# Patient Record
Sex: Female | Born: 1960 | Race: White | Hispanic: No | Marital: Married | Smoking: Never smoker
Health system: Southern US, Community
[De-identification: ages and names within clinical notes are randomized; demographics above are authoritative.]

## PROBLEM LIST (undated history)

## (undated) DIAGNOSIS — L509 Urticaria, unspecified: Secondary | ICD-10-CM

## (undated) DIAGNOSIS — F32A Depression, unspecified: Secondary | ICD-10-CM

## (undated) DIAGNOSIS — F329 Major depressive disorder, single episode, unspecified: Secondary | ICD-10-CM

## (undated) DIAGNOSIS — E7800 Pure hypercholesterolemia, unspecified: Secondary | ICD-10-CM

## (undated) HISTORY — DX: Depression, unspecified: F32.A

## (undated) HISTORY — DX: Major depressive disorder, single episode, unspecified: F32.9

## (undated) HISTORY — DX: Urticaria, unspecified: L50.9

---

## 1988-07-06 HISTORY — PX: TUBAL LIGATION: SHX77

## 1992-07-06 HISTORY — PX: CERVICAL CONE BIOPSY: SUR198

## 1998-11-18 ENCOUNTER — Other Ambulatory Visit: Admission: RE | Admit: 1998-11-18 | Discharge: 1998-11-18 | Payer: Self-pay | Admitting: Family Medicine

## 2002-11-30 ENCOUNTER — Other Ambulatory Visit: Admission: RE | Admit: 2002-11-30 | Discharge: 2002-11-30 | Payer: Self-pay | Admitting: Obstetrics and Gynecology

## 2004-01-29 ENCOUNTER — Other Ambulatory Visit: Admission: RE | Admit: 2004-01-29 | Discharge: 2004-01-29 | Payer: Self-pay | Admitting: Obstetrics and Gynecology

## 2005-03-10 ENCOUNTER — Other Ambulatory Visit: Admission: RE | Admit: 2005-03-10 | Discharge: 2005-03-10 | Payer: Self-pay | Admitting: Obstetrics and Gynecology

## 2013-11-21 ENCOUNTER — Ambulatory Visit: Payer: Self-pay | Admitting: Nurse Practitioner

## 2014-01-16 ENCOUNTER — Ambulatory Visit (INDEPENDENT_AMBULATORY_CARE_PROVIDER_SITE_OTHER): Payer: PRIVATE HEALTH INSURANCE | Admitting: Nurse Practitioner

## 2014-01-16 ENCOUNTER — Ambulatory Visit (INDEPENDENT_AMBULATORY_CARE_PROVIDER_SITE_OTHER): Payer: PRIVATE HEALTH INSURANCE

## 2014-01-16 ENCOUNTER — Encounter: Payer: Self-pay | Admitting: Nurse Practitioner

## 2014-01-16 ENCOUNTER — Encounter (INDEPENDENT_AMBULATORY_CARE_PROVIDER_SITE_OTHER): Payer: Self-pay

## 2014-01-16 VITALS — BP 110/64 | HR 68 | Temp 98.8°F | Ht 68.0 in | Wt 167.8 lb

## 2014-01-16 DIAGNOSIS — F411 Generalized anxiety disorder: Secondary | ICD-10-CM | POA: Insufficient documentation

## 2014-01-16 DIAGNOSIS — J301 Allergic rhinitis due to pollen: Secondary | ICD-10-CM

## 2014-01-16 DIAGNOSIS — Z1211 Encounter for screening for malignant neoplasm of colon: Secondary | ICD-10-CM

## 2014-01-16 DIAGNOSIS — J309 Allergic rhinitis, unspecified: Secondary | ICD-10-CM | POA: Insufficient documentation

## 2014-01-16 DIAGNOSIS — Z0000 Encounter for general adult medical examination without abnormal findings: Secondary | ICD-10-CM

## 2014-01-16 LAB — POCT CBC
GRANULOCYTE PERCENT: 48.4 % (ref 37–80)
HCT, POC: 40.6 % (ref 37.7–47.9)
HEMOGLOBIN: 13.1 g/dL (ref 12.2–16.2)
LYMPH, POC: 2.2 (ref 0.6–3.4)
MCH: 31.2 pg (ref 27–31.2)
MCHC: 32.4 g/dL (ref 31.8–35.4)
MCV: 96.2 fL (ref 80–97)
MPV: 9.2 fL (ref 0–99.8)
PLATELET COUNT, POC: 194 10*3/uL (ref 142–424)
POC GRANULOCYTE: 2.3 (ref 2–6.9)
POC LYMPH %: 46.9 % (ref 10–50)
RBC: 4.2 M/uL (ref 4.04–5.48)
RDW, POC: 12.9 %
WBC: 4.7 10*3/uL (ref 4.6–10.2)

## 2014-01-16 NOTE — Patient Instructions (Signed)

## 2014-01-16 NOTE — Progress Notes (Signed)
Subjective:    Patient ID: Alyssa Hoffman, female    DOB: 08-11-1960, 53 y.o.   MRN: [id number]  HPI Patinet in today for annual physical exam- she has not been seen in awhile- she is doing well today without coplaints. Patient Active Problem List   Diagnosis Date Noted  . GAD (generalized anxiety disorder) 01/16/2014  . Allergic rhinitis 01/16/2014   Outpatient Encounter Prescriptions as of 01/16/2014  Medication Sig  . citalopram (CELEXA) 20 MG tablet Take 20 mg by mouth daily.  Marland Kitchen loratadine (CLARITIN) 10 MG tablet Take 10 mg by mouth daily.       Review of Systems  Constitutional: Negative.   HENT: Negative.   Eyes: Negative.   Respiratory: Negative.   Cardiovascular: Negative.   Gastrointestinal: Negative.   Genitourinary: Negative.   Neurological: Negative.   Psychiatric/Behavioral: Negative.   All other systems reviewed and are negative.      Objective:   Physical Exam  Constitutional: She is oriented to person, place, and time. She appears well-developed and well-nourished.  HENT:  Head: Normocephalic.  Right Ear: Hearing, tympanic membrane, external ear and ear canal normal.  Left Ear: Hearing, tympanic membrane, external ear and ear canal normal.  Nose: Nose normal.  Mouth/Throat: Uvula is midline, oropharynx is clear and moist and mucous membranes are normal.  Eyes: Conjunctivae and EOM are normal. Pupils are equal, round, and reactive to light.  Neck: Normal range of motion. Neck supple. No JVD present. No thyromegaly present.  Cardiovascular: Normal rate, regular rhythm, normal heart sounds and intact distal pulses.   No murmur heard. Pulmonary/Chest: Effort normal and breath sounds normal. She has no wheezes. She has no rales.  Abdominal: Soft. Bowel sounds are normal. She exhibits no distension and no mass. There is no tenderness.  Musculoskeletal: Normal range of motion.  Neurological: She is alert and oriented to person, place, and time. She has  normal reflexes.  Skin: Skin is warm and dry.  Psychiatric: She has a normal mood and affect. Her behavior is normal. Judgment and thought content normal.   BP 110/64  Pulse 68  Temp(Src) 98.8 F (37.1 C) (Oral)  Ht 5' 8"  (1.727 m)  Wt 167 lb 12.8 oz (76.114 kg)  BMI 25.52 kg/m2  Adella Nissen, [name] Chest x ray- normal-Preliminary reading by Ronnald Collum, [name]  [facility]        Assessment & Plan:   1. GAD (generalized anxiety disorder)   2. Allergic rhinitis due to pollen   3. Encounter for screening colonoscopy   4. Annual physical exam    Orders Placed This Encounter  Procedures  . DG Chest 2 View    Standing Status: Future     Number of Occurrences: 1     Standing Expiration Date: 03/18/2015    Order Specific Question:  Reason for Exam (SYMPTOM  OR DIAGNOSIS REQUIRED)    Answer:  screening    Order Specific Question:  Is the patient pregnant?    Answer:  No    Order Specific Question:  Preferred imaging location?    Answer:  Internal  . CMP14+EGFR  . NMR, lipoprofile  . Thyroid Panel With TSH  . Vit D  25 hydroxy (rtn osteoporosis monitoring)  . Ambulatory referral to Gastroenterology    Referral Priority:  Routine    Referral Type:  Consultation    Referral Reason:  Specialty Services Required    Requested Specialty:  Gastroenterology    Number of Visits Requested:  1  . POCT CBC  . EKG 12-Lead   Meds ordered this encounter  Medications  . citalopram (CELEXA) 20 MG tablet    Sig: Take 20 mg by mouth daily.  Marland Kitchen loratadine (CLARITIN) 10 MG tablet    Sig: Take 10 mg by mouth daily.    Labs pending Health maintenance reviewed Diet and exercise encouraged Continue all meds Follow up  In 6 months   [place], [name]

## 2014-01-17 LAB — NMR, LIPOPROFILE
CHOLESTEROL: 263 mg/dL — AB (ref 100–199)
HDL CHOLESTEROL BY NMR: 69 mg/dL (ref >39)
HDL Particle Number: 43.2 umol/L (ref >=30.5)
LDL PARTICLE NUMBER: 1666 nmol/L — AB (ref <1000)
LDL SIZE: 21.7 nm (ref >20.5)
LDLC SERPL CALC-MCNC: 166 mg/dL — AB (ref 0–99)
LP-IR Score: 31 (ref <=45)
Small LDL Particle Number: 469 nmol/L (ref <=527)
TRIGLYCERIDES BY NMR: 141 mg/dL (ref 0–149)

## 2014-01-17 LAB — CMP14+EGFR
ALBUMIN: 4.5 g/dL (ref 3.5–5.5)
ALT: 19 IU/L (ref 0–32)
AST: 19 IU/L (ref 0–40)
Albumin/Globulin Ratio: 2.3 (ref 1.1–2.5)
Alkaline Phosphatase: 67 IU/L (ref 39–117)
BILIRUBIN TOTAL: 0.5 mg/dL (ref 0.0–1.2)
BUN/Creatinine Ratio: 15 (ref 9–23)
BUN: 11 mg/dL (ref 6–24)
CALCIUM: 9.8 mg/dL (ref 8.7–10.2)
CHLORIDE: 100 mmol/L (ref 97–108)
CO2: 26 mmol/L (ref 18–29)
Creatinine, Ser: 0.75 mg/dL (ref 0.57–1.00)
GFR calc non Af Amer: 90 mL/min/{1.73_m2} (ref >59)
GFR, EST AFRICAN AMERICAN: 104 mL/min/{1.73_m2} (ref >59)
GLUCOSE: 80 mg/dL (ref 65–99)
Globulin, Total: 2 g/dL (ref 1.5–4.5)
POTASSIUM: 4.9 mmol/L (ref 3.5–5.2)
Sodium: 144 mmol/L (ref 134–144)
TOTAL PROTEIN: 6.5 g/dL (ref 6.0–8.5)

## 2014-01-17 LAB — VITAMIN D 25 HYDROXY (VIT D DEFICIENCY, FRACTURES): VIT D 25 HYDROXY: 56.5 ng/mL (ref 30.0–100.0)

## 2014-01-29 ENCOUNTER — Encounter: Payer: Self-pay | Admitting: Nurse Practitioner

## 2014-02-16 ENCOUNTER — Encounter: Payer: Self-pay | Admitting: Internal Medicine

## 2014-04-25 ENCOUNTER — Encounter: Payer: Self-pay | Admitting: Internal Medicine

## 2014-11-07 ENCOUNTER — Encounter: Payer: Self-pay | Admitting: Internal Medicine

## 2015-01-28 ENCOUNTER — Encounter: Payer: Self-pay | Admitting: Internal Medicine

## 2015-02-18 ENCOUNTER — Ambulatory Visit (AMBULATORY_SURGERY_CENTER): Payer: PRIVATE HEALTH INSURANCE | Admitting: *Deleted

## 2015-02-18 VITALS — Ht 67.5 in | Wt 168.0 lb

## 2015-02-18 DIAGNOSIS — Z1211 Encounter for screening for malignant neoplasm of colon: Secondary | ICD-10-CM

## 2015-02-18 MED ORDER — NA SULFATE-K SULFATE-MG SULF 17.5-3.13-1.6 GM/177ML PO SOLN: ORAL | Status: DC

## 2015-02-18 NOTE — Progress Notes (Signed)
Patient denies any allergies to eggs or soy. Patient denies any problems with sedation. Patient denies any oxygen use at home and does not take any diet/weight loss medications. EMMI education assisgned to patient on colonoscopy, this was explained and instructions given to patient. 

## 2015-02-19 ENCOUNTER — Encounter: Payer: Self-pay | Admitting: Internal Medicine

## 2015-03-04 ENCOUNTER — Ambulatory Visit (AMBULATORY_SURGERY_CENTER): Payer: PRIVATE HEALTH INSURANCE | Admitting: Internal Medicine

## 2015-03-04 ENCOUNTER — Encounter: Payer: Self-pay | Admitting: Internal Medicine

## 2015-03-04 VITALS — BP 135/78 | HR 58 | Temp 96.3°F | Resp 16 | Ht 67.5 in | Wt 168.0 lb

## 2015-03-04 DIAGNOSIS — Z1211 Encounter for screening for malignant neoplasm of colon: Secondary | ICD-10-CM

## 2015-03-04 MED ORDER — SODIUM CHLORIDE 0.9 % IV SOLN: 500.0000 mL | INTRAVENOUS | Status: DC

## 2015-03-04 NOTE — Op Note (Signed)
[facility] [address] [place], [address]   COLONOSCOPY PROCEDURE REPORT  PATIENT: Alyssa Hoffman, Alyssa Hoffman  MR#: [id number] BIRTHDATE: 1961/06/30 , 54  yrs. old GENDER: female ENDOSCOPIST: Beverley Fiedler, MD REFERRED XB:JYNW Sheron Nightingale, N.P. PROCEDURE DATE:  03/04/2015 PROCEDURE:   Colonoscopy, screening First Screening Colonoscopy - Avg.  risk and is 50 yrs.  old or older Yes.  Prior Negative Screening - Now for repeat screening. N/A  History of Adenoma - Now for follow-up colonoscopy & has been > or = to 3 yrs.  N/A  Polyps removed today? No Polyps removed today? No Recommend repeat exam, <10 yrs? No ASA CLASS:   Class II INDICATIONS:Screening for colonic neoplasia and Colorectal Neoplasm Risk Assessment for this procedure is average risk., 1st colonoscopy MEDICATIONS: Monitored anesthesia care and Propofol 200 mg IV  DESCRIPTION OF PROCEDURE:   After the risks benefits and alternatives of the procedure were thoroughly explained, informed consent was obtained.  The digital rectal exam revealed no rectal mass.   The LB PCF Q180 [id number]  endoscope was introduced through the anus and advanced to the cecum, which was identified by both the appendix and ileocecal valve. No adverse events experienced. The quality of the prep was good.  (Suprep was used)  The instrument was then slowly withdrawn as the colon was fully examined. Estimated blood loss is zero unless otherwise noted in this procedure report.      COLON FINDINGS: A normal appearing cecum, ileocecal valve, and appendiceal orifice were identified.  The ascending, transverse, descending, sigmoid colon, and rectum appeared unremarkable. Retroflexed views revealed internal hemorrhoids. The time to cecum = 5.3 Withdrawal time = 9.9   The scope was withdrawn and the procedure completed.  COMPLICATIONS: There were no immediate complications.  ENDOSCOPIC IMPRESSION: Normal colonoscopy  RECOMMENDATIONS: You  should continue to follow colorectal cancer screening guidelines for "routine risk" patients with a repeat colonoscopy in 10 years. There is no need for FOBT (stool) testing for at least 5 years.  eSigned:  Beverley Fiedler, MD 03/04/2015 9:42 AM   cc: Bennie Pierini, NP and The Patient

## 2015-03-04 NOTE — Progress Notes (Signed)
Report to PACU, RN, vss, BBS= Clear.  

## 2015-03-04 NOTE — Patient Instructions (Signed)
Discharge instructions given. Normal exam. Resume previous medications. YOU HAD AN ENDOSCOPIC PROCEDURE TODAY AT THE [facility]:   Refer to the procedure report that was given to you for any specific questions about what was found during the examination.  If the procedure report does not answer your questions, please call your gastroenterologist to clarify.  If you requested that your care partner not be given the details of your procedure findings, then the procedure report has been included in a sealed envelope for you to review at your convenience later.  YOU SHOULD EXPECT: Some feelings of bloating in the abdomen. Passage of more gas than usual.  Walking can help get rid of the air that was put into your GI tract during the procedure and reduce the bloating. If you had a lower endoscopy (such as a colonoscopy or flexible sigmoidoscopy) you may notice spotting of blood in your stool or on the toilet paper. If you underwent a bowel prep for your procedure, you may not have a normal bowel movement for a few days.  Please Note:  You might notice some irritation and congestion in your nose or some drainage.  This is from the oxygen used during your procedure.  There is no need for concern and it should clear up in a day or so.  SYMPTOMS TO REPORT IMMEDIATELY:   Following lower endoscopy (colonoscopy or flexible sigmoidoscopy):  Excessive amounts of blood in the stool  Significant tenderness or worsening of abdominal pains  Swelling of the abdomen that is new, acute  Fever of 100F or higher   For urgent or emergent issues, a gastroenterologist can be reached at any hour by calling [phone].   DIET: Your first meal following the procedure should be a small meal and then it is ok to progress to your normal diet. Heavy or fried foods are harder to digest and may make you feel nauseous or bloated.  Likewise, meals heavy in dairy and vegetables can increase bloating.  Drink plenty  of fluids but you should avoid alcoholic beverages for 24 hours.  ACTIVITY:  You should plan to take it easy for the rest of today and you should NOT DRIVE or use heavy machinery until tomorrow (because of the sedation medicines used during the test).    FOLLOW UP: Our staff will call the number listed on your records the next business day following your procedure to check on you and address any questions or concerns that you may have regarding the information given to you following your procedure. If we do not reach you, we will leave a message.  However, if you are feeling well and you are not experiencing any problems, there is no need to return our call.  We will assume that you have returned to your regular daily activities without incident.  If any biopsies were taken you will be contacted by phone or by letter within the next 1-3 weeks.  Please call us at [phone] if you have not heard about the biopsies in 3 weeks.    SIGNATURES/CONFIDENTIALITY: You and/or your care partner have signed paperwork which will be entered into your electronic medical record.  These signatures attest to the fact that that the information above on your After Visit Summary has been reviewed and is understood.  Full responsibility of the confidentiality of this discharge information lies with you and/or your care-partner. 

## 2015-03-05 ENCOUNTER — Telehealth: Payer: Self-pay

## 2015-03-05 NOTE — Telephone Encounter (Signed)
  Follow up Call-  Call back number 03/04/2015  Post procedure Call Back phone  # [phone]  Permission to leave phone message Yes     Patient questions:  Do you have a fever, pain , or abdominal swelling? No. Pain Score  0 *  Have you tolerated food without any problems? Yes.    Have you been able to return to your normal activities? Yes.    Do you have any questions about your discharge instructions: Diet   No. Medications  No. Follow up visit  No.  Do you have questions or concerns about your Care? No.  Actions: * If pain score is 4 or above: No action needed, pain <4.

## 2016-01-01 ENCOUNTER — Other Ambulatory Visit: Payer: Self-pay | Admitting: Obstetrics and Gynecology

## 2016-01-01 DIAGNOSIS — R928 Other abnormal and inconclusive findings on diagnostic imaging of breast: Secondary | ICD-10-CM

## 2016-01-02 ENCOUNTER — Emergency Department (HOSPITAL_COMMUNITY)
Admission: EM | Admit: 2016-01-02 | Discharge: 2016-01-02 | Disposition: A | Discharge status: Home / Self Care | Payer: PRIVATE HEALTH INSURANCE | Attending: Emergency Medicine | Admitting: Emergency Medicine

## 2016-01-02 ENCOUNTER — Encounter (HOSPITAL_COMMUNITY): Payer: Self-pay | Admitting: Emergency Medicine

## 2016-01-02 ENCOUNTER — Emergency Department (HOSPITAL_COMMUNITY): Payer: PRIVATE HEALTH INSURANCE

## 2016-01-02 DIAGNOSIS — R079 Chest pain, unspecified: Secondary | ICD-10-CM | POA: Insufficient documentation

## 2016-01-02 DIAGNOSIS — Z79899 Other long term (current) drug therapy: Secondary | ICD-10-CM | POA: Insufficient documentation

## 2016-01-02 DIAGNOSIS — F329 Major depressive disorder, single episode, unspecified: Secondary | ICD-10-CM | POA: Insufficient documentation

## 2016-01-02 HISTORY — DX: Pure hypercholesterolemia, unspecified: E78.00

## 2016-01-02 LAB — CBC
HCT: 41.9 % (ref 36.0–46.0)
HEMOGLOBIN: 14.3 g/dL (ref 12.0–15.0)
MCH: 32.6 pg (ref 26.0–34.0)
MCHC: 34.1 g/dL (ref 30.0–36.0)
MCV: 95.4 fL (ref 78.0–100.0)
PLATELETS: 227 10*3/uL (ref 150–400)
RBC: 4.39 MIL/uL (ref 3.87–5.11)
RDW: 12.2 % (ref 11.5–15.5)
WBC: 4.9 10*3/uL (ref 4.0–10.5)

## 2016-01-02 LAB — BASIC METABOLIC PANEL
ANION GAP: 9 (ref 5–15)
BUN: 11 mg/dL (ref 6–20)
CHLORIDE: 102 mmol/L (ref 101–111)
CO2: 27 mmol/L (ref 22–32)
Calcium: 9.5 mg/dL (ref 8.9–10.3)
Creatinine, Ser: 0.9 mg/dL (ref 0.44–1.00)
GFR calc Af Amer: >60 mL/min (ref >60)
GLUCOSE: 95 mg/dL (ref 65–99)
POTASSIUM: 4.1 mmol/L (ref 3.5–5.1)
SODIUM: 138 mmol/L (ref 135–145)

## 2016-01-02 LAB — TROPONIN I: Troponin I: <0.03 ng/mL (ref <0.03)

## 2016-01-02 MED ORDER — NITROGLYCERIN 0.4 MG SL SUBL: 0.4000 mg | SUBLINGUAL_TABLET | SUBLINGUAL | Status: DC | PRN

## 2016-01-02 MED ORDER — GI COCKTAIL ~~LOC~~
30.0000 mL | Freq: Once | ORAL | Status: AC
  Administered 2016-01-02: 30 mL via ORAL
  Filled 2016-01-02: qty 30

## 2016-01-02 MED ORDER — ASPIRIN 81 MG PO CHEW
324.0000 mg | CHEWABLE_TABLET | Freq: Once | ORAL | Status: AC
  Administered 2016-01-02: 324 mg via ORAL
  Filled 2016-01-02: qty 4

## 2016-01-02 NOTE — ED Notes (Signed)
MD at bedside. 

## 2016-01-02 NOTE — ED Notes (Signed)
PT refused further evaluation or admission for full cardiac workup.

## 2016-01-02 NOTE — Discharge Instructions (Signed)
Take your usual prescriptions as previously directed.  Call your regular medical doctor tomorrow to schedule a follow up appointment within the next day. Call the Cardiologist tomorrow to schedule a follow up appointment within the next 3 days.  Return to the Emergency Department immediately sooner if worsening.

## 2016-01-02 NOTE — ED Provider Notes (Signed)
CSN: [id number]     Arrival date & time 01/02/16  1309 History   First MD Initiated Contact with Patient 01/02/16 1456     Chief Complaint  Patient presents with  . Chest Pain     HPI Pt was seen at 1505. Per pt, c/o gradual onset and resolution of one episode of CP that occurred today approximately 11am. Pt was sitting in a chair typing when her symptoms began. Pt describes the CP as "squeezing," lasting approximately 15-1min before spontaneously resolving. States now the area is "sore feeling." Has been associated with "pressure" in her jaw and back, as well as diaphoresis. Pt states this is the 3rd episode in 3 months. States her brother died of a MI at 36yo. Denies palpitations, no SOB/cough, no abd pain, no N/V/D, no rash, no fevers.    Past Medical History  Diagnosis Date  . Depression   . Hives of unknown origin   . High cholesterol    Past Surgical History  Procedure Laterality Date  . Tubal ligation  1990  . Cervical cone biopsy  1994   Family History  Problem Relation Age of Onset  . Colon cancer Neg Hx   . Heart attack Brother 56    deceased   Social History  Substance Use Topics  . Smoking status: Never Smoker   . Smokeless tobacco: Never Used  . Alcohol Use: 1.2 oz/week    2 Cans of beer per week     Comment: social    Review of Systems ROS: Statement: All systems negative except as marked or noted in the HPI; Constitutional: Negative for fever and chills. ; ; Eyes: Negative for eye pain, redness and discharge. ; ; ENMT: Negative for ear pain, hoarseness, nasal congestion, sinus pressure and sore throat. ; ; Cardiovascular: +CP, diaphoresis. Negative for palpitations, dyspnea and peripheral edema. ; ; Respiratory: Negative for cough, wheezing and stridor. ; ; Gastrointestinal: Negative for nausea, vomiting, diarrhea, abdominal pain, blood in stool, hematemesis, jaundice and rectal bleeding. . ; ; Genitourinary: Negative for dysuria, flank pain and hematuria. ; ;  Musculoskeletal: Negative for back pain and neck pain. Negative for swelling and trauma.; ; Skin: Negative for pruritus, rash, abrasions, blisters, bruising and skin lesion.; ; Neuro: Negative for headache, lightheadedness and neck stiffness. Negative for weakness, altered level of consciousness, altered mental status, extremity weakness, paresthesias, involuntary movement, seizure and syncope.      Allergies  Codeine and Shrimp  Home Medications   Prior to Admission medications   Medication Sig Start Date End Date Taking? Authorizing Provider  Ascorbic Acid (VITAMIN C PO) Take 1 tablet by mouth daily.   Yes Historical Provider, MD  B Complex Vitamins (VITAMIN B COMPLEX PO) Take 1 tablet by mouth daily.   Yes Historical Provider, MD  Cholecalciferol (VITAMIN D PO) Take 1 tablet by mouth daily.   Yes Historical Provider, MD  citalopram (CELEXA) 20 MG tablet Take 20 mg by mouth daily.   Yes Historical Provider, MD  loratadine (CLARITIN) 10 MG tablet Take 10 mg by mouth daily.   Yes Historical Provider, MD   BP 133/79 mmHg  Pulse 75  Temp(Src) 98.2 F (36.8 C) (Temporal)  Resp 14  Ht  (1.727 m)  Wt 187 lb (84.823 kg)  BMI 28.44 kg/m2  SpO2 95% Physical Exam  1510: Physical examination:  Nursing notes reviewed; Vital signs and O2 SAT reviewed;  Constitutional: Well developed, Well nourished, Well hydrated, In no acute distress; Head:  Normocephalic, atraumatic; Eyes: EOMI, PERRL, No scleral icterus; ENMT: Mouth and pharynx normal, Mucous membranes moist; Neck: Supple, Full range of motion, No lymphadenopathy; Cardiovascular: Regular rate and rhythm, No gallop; Respiratory: Breath sounds clear & equal bilaterally, No wheezes.  Speaking full sentences with ease, Normal respiratory effort/excursion; Chest: Nontender, Movement normal; Abdomen: Soft, Nontender, Nondistended, Normal bowel sounds; Genitourinary: No CVA tenderness; Extremities: Pulses normal, No tenderness, No edema, No calf  edema or asymmetry.; Neuro: AA&Ox3, Major CN grossly intact.  Speech clear. No gross focal motor or sensory deficits in extremities.; Skin: Color normal, Warm, Dry.   ED Course  Procedures (including critical care time) Labs Review  Imaging Review  I have personally reviewed and evaluated these images and lab results as part of my medical decision-making.   EKG Interpretation   Date/Time:  Thursday January 02 2016 13:13:03 EDT Ventricular Rate:  82 PR Interval:  188 QRS Duration: 84 QT Interval:  364 QTC Calculation: 425 R Axis:   6 Text Interpretation:  Normal sinus rhythm Possible Left atrial enlargement  No old tracing to compare Confirmed by [facility] Hospital[facility]US  MD, Nicholos JohnsKATHLEEN [phone]([id number]) on  01/02/2016 4:06:39 PM      MDM  MDM Reviewed: previous chart, nursing note and vitals Reviewed previous: labs and ECG Interpretation: labs, ECG and x-ray   Results for orders placed or performed during the hospital encounter of 01/02/16  CBC  Result Value Ref Range   WBC 4.9 4.0 - 10.5 K/uL   RBC 4.39 3.87 - 5.11 MIL/uL   Hemoglobin 14.3 12.0 - 15.0 g/dL   HCT 60.441.9 54.036.0 - 98.146.0 %   MCV 95.4 78.0 - 100.0 fL   MCH 32.6 26.0 - 34.0 pg   MCHC 34.1 30.0 - 36.0 g/dL   RDW 19.112.2 47.811.5 - 29.515.5 %   Platelets 227 150 - 400 K/uL  Basic metabolic panel  Result Value Ref Range   Sodium 138 135 - 145 mmol/L   Potassium 4.1 3.5 - 5.1 mmol/L   Chloride 102 101 - 111 mmol/L   CO2 27 22 - 32 mmol/L   Glucose, Bld 95 65 - 99 mg/dL   BUN 11 6 - 20 mg/dL   Creatinine, Ser 6.210.90 0.44 - 1.00 mg/dL   Calcium 9.5 8.9 - 30.810.3 mg/dL   GFR calc non Af Amer >60 >60 mL/min   GFR calc Af Amer >60 >60 mL/min   Anion gap 9 5 - 15  Troponin I  (0, 3, 6)  Result Value Ref Range   Troponin I <0.03 <0.03 ng/mL  Troponin I  (0, 3, 6)  Result Value Ref Range   Troponin I <0.03 <0.03 ng/mL   Dg Chest 2 View 01/02/2016  CLINICAL DATA:  Mid chest, BILATERAL shoulder, and BILATERAL jaw pain this morning, third episode in 3  months EXAM: CHEST  2 VIEW COMPARISON:  01/16/2014 FINDINGS: Upper normal heart size. Mediastinal contours and pulmonary vascularity normal. Lungs clear. No pleural effusion or pneumothorax. Pectus excavatum. No acute osseous lesions. IMPRESSION: Pectus excavatum. No acute abnormalities. Electronically Signed   By: Ulyses SouthwardMark  Boles M.D.   On: 01/02/2016 13:32      1755:  Pt informed re: dx testing results, my concern for cardiac source for her symptoms, and that I recommend observation admission for further evaluation.  Pt refuses admission.  I encouraged pt to stay to observation admission, but did agree to stay for 2nd troponin. 2nd troponin reassuring, again refuses observation admission.  Pt makes her own medical decisions.  Risks of AMA explained to pt, including, but not limited to:  stroke, heart attack, cardiac arrythmia ("irregular heart rate/beat"), "passing out," temporary and/or permanent disability, death.  Pt verb understanding and continues to refuse admission, understanding the consequences of her decision.  I encouraged pt to follow up with her PMD and the Cardiologist tomorrow and return to the ED immediately if symptoms return, or for any other concerns.  Pt verb understanding, agreeable.     Samuel JesterKathleen Anisha Starliper, DO 01/05/16 1443

## 2016-01-02 NOTE — ED Notes (Addendum)
Patient complaining of severe "squeezing" to mid sternal area at 1100 today. States she went to Urgent Care and was sent here. States pain has resolved but does still have some soreness to same area. States this is 3rd time this has happened in 3 months. States she also had "pressure to both my jaws and in my back."

## 2016-01-14 ENCOUNTER — Ambulatory Visit
Admission: RE | Admit: 2016-01-14 | Discharge: 2016-01-14 | Disposition: A | Discharge status: Home / Self Care | Payer: BC Managed Care – PPO | Source: Ambulatory Visit | Attending: Obstetrics and Gynecology | Admitting: Obstetrics and Gynecology

## 2016-01-14 DIAGNOSIS — R928 Other abnormal and inconclusive findings on diagnostic imaging of breast: Secondary | ICD-10-CM

## 2016-03-16 ENCOUNTER — Ambulatory Visit: Payer: PRIVATE HEALTH INSURANCE | Admitting: Physician Assistant

## 2016-03-16 ENCOUNTER — Ambulatory Visit (INDEPENDENT_AMBULATORY_CARE_PROVIDER_SITE_OTHER): Payer: Worker's Compensation | Admitting: Physician Assistant

## 2016-03-16 ENCOUNTER — Encounter: Payer: Self-pay | Admitting: Physician Assistant

## 2016-03-16 VITALS — BP 142/85 | HR 83 | Temp 98.0°F | Ht 68.0 in | Wt 190.2 lb

## 2016-03-16 DIAGNOSIS — S39012A Strain of muscle, fascia and tendon of lower back, initial encounter: Secondary | ICD-10-CM

## 2016-03-16 MED ORDER — DICLOFENAC SODIUM 75 MG PO TBEC: 75.0000 mg | DELAYED_RELEASE_TABLET | Freq: Two times a day (BID) | ORAL | 0 refills | Status: DC

## 2016-03-16 MED ORDER — CYCLOBENZAPRINE HCL 10 MG PO TABS: 10.0000 mg | ORAL_TABLET | Freq: Three times a day (TID) | ORAL | 0 refills | Status: DC | PRN

## 2016-03-16 NOTE — Progress Notes (Signed)
BP (!) 142/85 (BP Location: Left Arm, Patient Position: Sitting, Cuff Size: Large)   Pulse 83   Temp 98 F (36.7 C) (Oral)   Ht 5\' 8"  (1.727 m)   Wt 190 lb 3.2 oz (86.3 kg)   BMI 28.92 kg/m    Subjective:    Patient ID: Alyssa Hoffman, female    DOB: 10/02/1960, 55 y.o.   MRN: [id number]  HPI: Alyssa Hoffman is a 55 y.o. female presenting on 03/16/2016 for worker comp (Patient fell going up stairs on 03/10/16. 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 On 03/10/2016 she tripped going up some steps and stubbed her left toe, it is feeling better. She went on into the office and sat down to work. In about 45 minutes when she attempted to stand back up she felt the pain in her left lumbar area. This is where the pain has continued over the past few days. She is used ice and stretching and taken over-the-counter anti-inflammatories with a significant amount of improvement. For the first 24 hours she could hardly bend at the waist. By 9/917 she was able to sit in the chair again. The patient states that she needs to return to work this week and there are accommodations they can make for her not to the lifting anything. The patient does have vacation next week. I feel that with limited lifting and rest that she should be able to return to full duty on 03/30/2016.  Relevant past medical, surgical, family and social history reviewed and updated as indicated. Interim medical history since our last visit reviewed. Allergies and medications reviewed and updated. DATA REVIEWED: CHART IN EPIC  Review of Systems  Constitutional: Negative.  Negative for activity change, fatigue and fever.  HENT: Negative.   Eyes: Negative.   Respiratory: Negative.  Negative for cough.   Cardiovascular: Negative.  Negative for chest pain.  Gastrointestinal: Negative.  Negative for abdominal pain.  Endocrine: Negative.   Genitourinary: Negative.  Negative for dysuria.  Musculoskeletal: Positive for  back pain and myalgias.  Skin: Negative.   Neurological: Negative.     Per HPI unless specifically indicated above     Medication List       Accurate as of 03/16/16  9:59 AM. Always use your most recent med list.          citalopram 20 MG tablet Commonly known as:  CELEXA Take 20 mg by mouth daily.   CONTRAVE 8-90 MG Tb12 Generic drug:  Naltrexone-Bupropion HCl ER   cyclobenzaprine 10 MG tablet Commonly known as:  FLEXERIL Take 1 tablet (10 mg total) by mouth 3 (three) times daily as needed for muscle spasms.   diclofenac 75 MG EC tablet Commonly known as:  VOLTAREN Take 1 tablet (75 mg total) by mouth 2 (two) times daily.   loratadine 10 MG tablet Commonly known as:  CLARITIN Take 10 mg by mouth daily.   VITAMIN B COMPLEX PO Take 1 tablet by mouth daily.   VITAMIN C PO Take 1 tablet by mouth daily.   VITAMIN D PO Take 1 tablet by mouth daily.          Objective:    BP (!) 142/85 (BP Location: Left Arm, Patient Position: Sitting, Cuff Size: Large)   Pulse 83   Temp 98 F (36.7 C) (Oral)   Ht 5\' 8"  (1.727 m)   Wt 190 lb 3.2 oz (86.3 kg)   BMI 28.92  kg/m   Allergies  Allergen Reactions  . Codeine Nausea And Vomiting    Heart palpitations   . Shrimp [Shellfish Allergy]     Wt Readings from Last 3 Encounters:  03/16/16 190 lb 3.2 oz (86.3 kg)  01/02/16 187 lb (84.8 kg)  03/04/15 168 lb (76.2 kg)    Physical Exam  Constitutional: She is oriented to person, place, and time. She appears well-developed and well-nourished.  HENT:  Head: Normocephalic and atraumatic.  Eyes: Conjunctivae and EOM are normal. Pupils are equal, round, and reactive to light.  Cardiovascular: Normal rate, regular rhythm, normal heart sounds and intact distal pulses.   Pulmonary/Chest: Effort normal and breath sounds normal.  Abdominal: Soft. Bowel sounds are normal.  Musculoskeletal:       Lumbar back: She exhibits decreased range of motion, tenderness, swelling, pain  and spasm.  Neurological: She is alert and oriented to person, place, and time. She has normal strength and normal reflexes.  Neg straight leg raise bilaterally  Skin: Skin is warm and dry. No rash noted.  Psychiatric: She has a normal mood and affect. Her behavior is normal. Judgment and thought content normal.        Assessment & Plan:   1. Lumbar strain, initial encounter Continue rest, heat and stretching - diclofenac (VOLTAREN) 75 MG EC tablet; Take 1 tablet (75 mg total) by mouth 2 (two) times daily.  Dispense: 60 tablet; Refill: 0 - cyclobenzaprine (FLEXERIL) 10 MG tablet; Take 1 tablet (10 mg total) by mouth 3 (three) times daily as needed for muscle spasms.  Dispense: 40 tablet; Refill: 0 Return if not improved.  Should return to full duty 03/30/16.  Continue all other maintenance medications as listed above.  Follow up plan: No Follow-up on file.  Counseling provided for all of the age appropriate vaccines. No orders of the defined types were placed in this encounter.   Remus Loffler PA-C [facility] [address]  [place], [place] [address] [phone]   03/16/2016, 9:59 AM

## 2016-03-16 NOTE — Patient Instructions (Signed)

## 2016-04-12 ENCOUNTER — Other Ambulatory Visit: Payer: Self-pay | Admitting: Physician Assistant

## 2016-04-12 DIAGNOSIS — S39012A Strain of muscle, fascia and tendon of lower back, initial encounter: Secondary | ICD-10-CM

## 2016-05-11 ENCOUNTER — Other Ambulatory Visit: Payer: Self-pay | Admitting: *Deleted

## 2016-05-11 DIAGNOSIS — S39012A Strain of muscle, fascia and tendon of lower back, initial encounter: Secondary | ICD-10-CM

## 2016-05-11 MED ORDER — DICLOFENAC SODIUM 75 MG PO TBEC: 75.0000 mg | DELAYED_RELEASE_TABLET | Freq: Two times a day (BID) | ORAL | 0 refills | Status: DC

## 2017-08-09 ENCOUNTER — Ambulatory Visit: Payer: Self-pay | Admitting: Physician Assistant

## 2017-10-01 IMAGING — DX DG CHEST 2V
2 series · 2 of 2 positions shown · non-contrast
Comparison: 01/16/2014

CLINICAL DATA: Mid chest, BILATERAL shoulder, and BILATERAL jaw
pain this morning, third episode in 3 months

EXAM:
CHEST  2 VIEW

[chest pa]
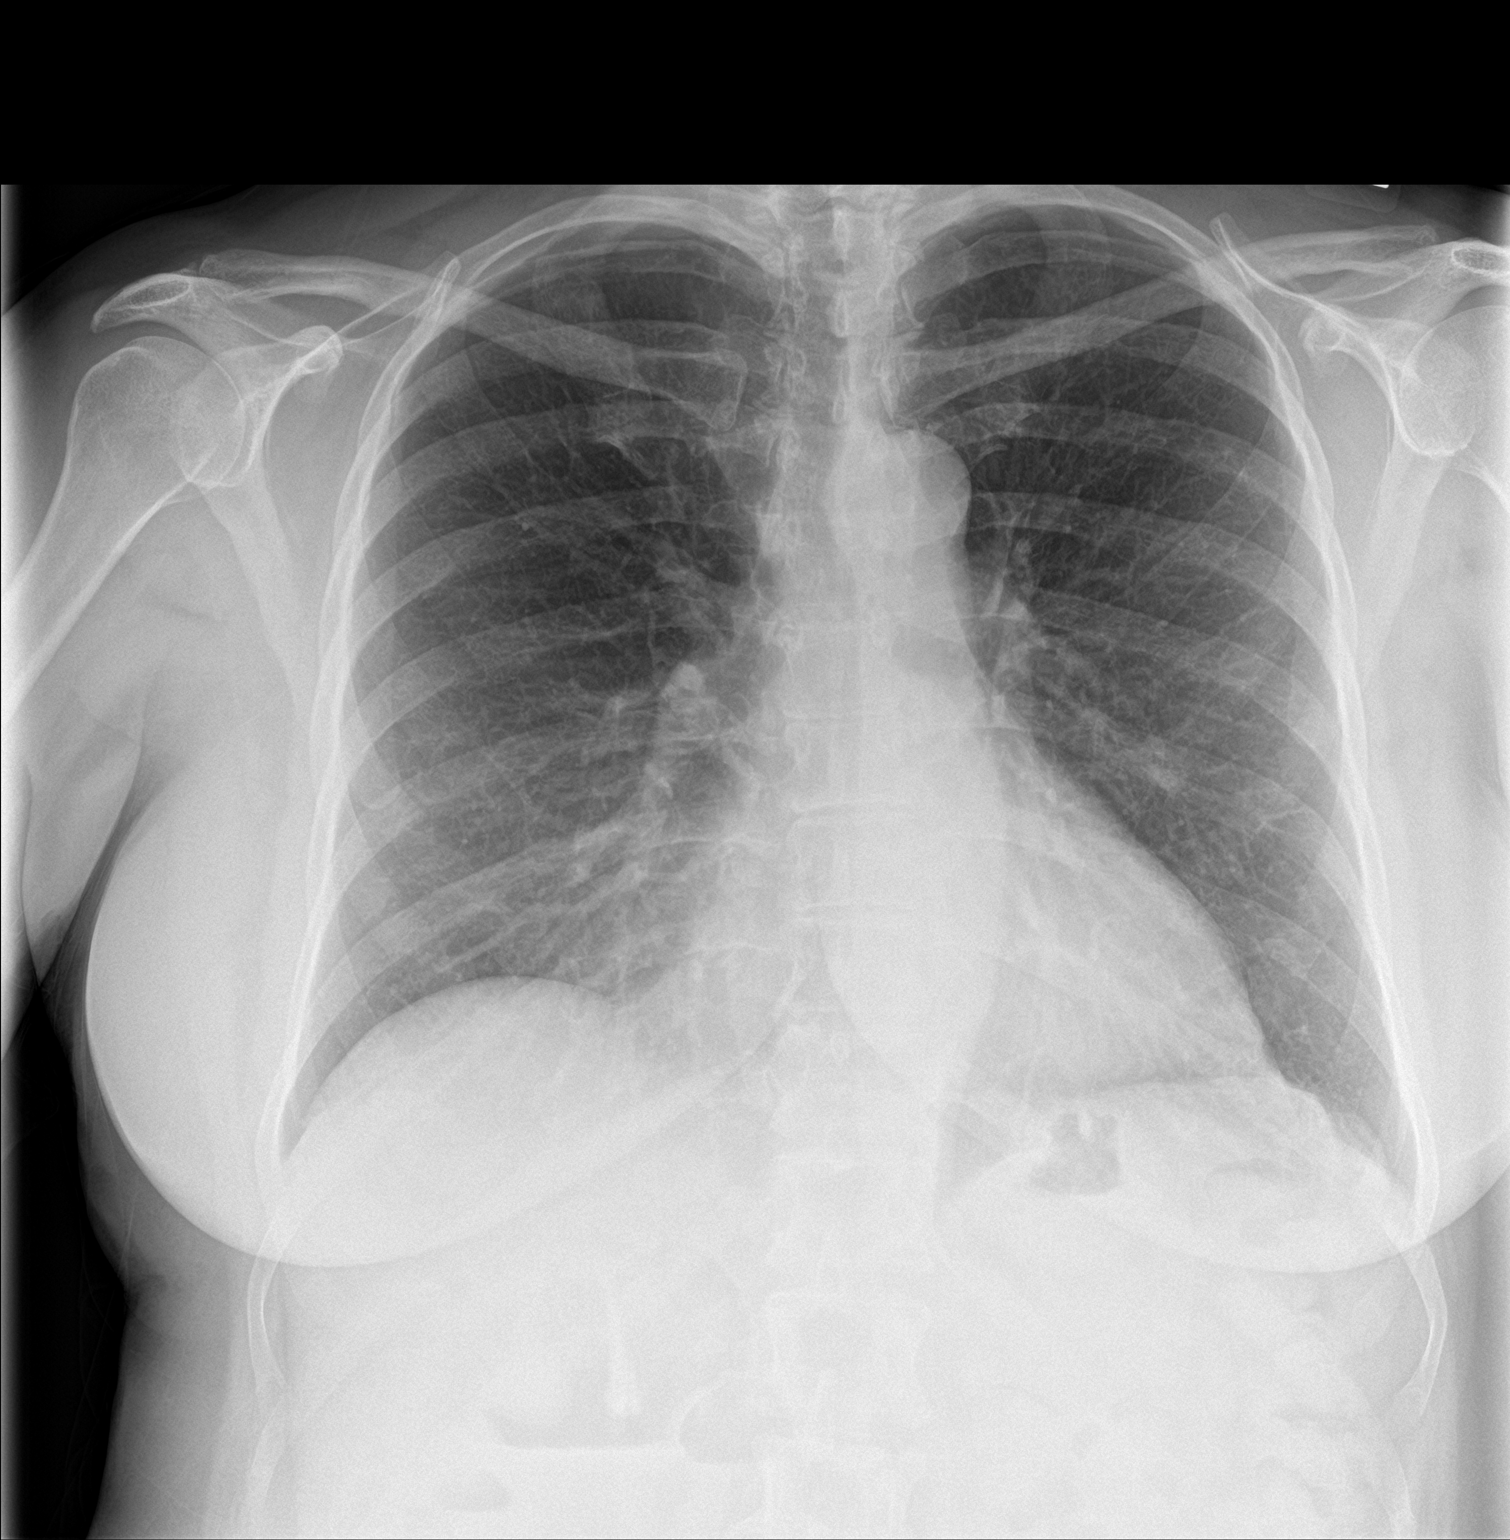

[chest lat]
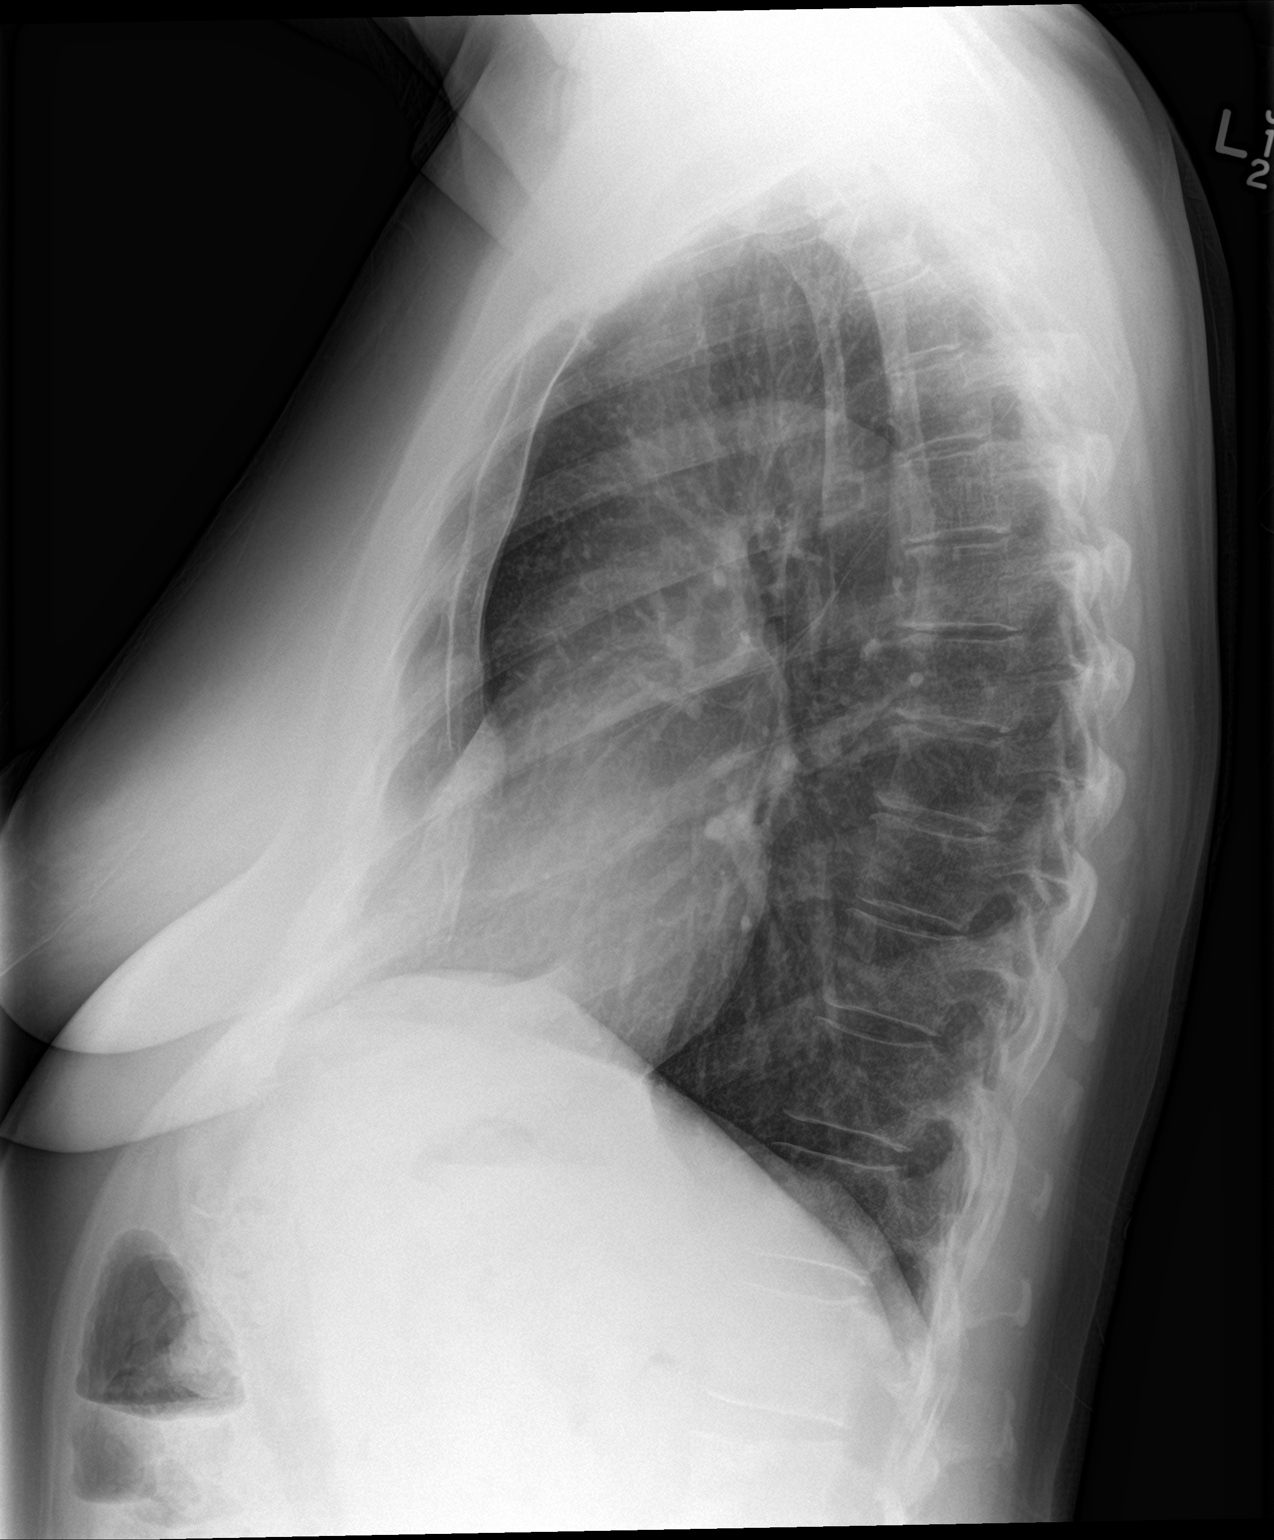

[2 of 2 positions shown; findings below may reference images not displayed]

FINDINGS: Upper normal heart size.

Mediastinal contours and pulmonary vascularity normal.

Lungs clear.

No pleural effusion or pneumothorax.

Pectus excavatum.

No acute osseous lesions.
IMPRESSION: Pectus excavatum.

No acute abnormalities.

## 2018-01-12 ENCOUNTER — Encounter: Payer: Self-pay | Admitting: Physician Assistant

## 2018-01-12 ENCOUNTER — Ambulatory Visit: Payer: PRIVATE HEALTH INSURANCE | Admitting: Physician Assistant

## 2018-01-12 VITALS — BP 101/71 | HR 89 | Temp 97.1°F | Ht 68.0 in | Wt 190.6 lb

## 2018-01-12 DIAGNOSIS — L03011 Cellulitis of right finger: Secondary | ICD-10-CM | POA: Diagnosis not present

## 2018-01-12 MED ORDER — CLINDAMYCIN HCL 300 MG PO CAPS: 300.0000 mg | ORAL_CAPSULE | Freq: Three times a day (TID) | ORAL | 1 refills | Status: DC

## 2018-01-14 NOTE — Progress Notes (Signed)
BP 101/71   Pulse 89   Temp (!) 97.1 F (36.2 C) (Oral)   Ht 5\' 8"  (1.727 m)   Wt 190 lb 9.6 oz (86.5 kg)   BMI 28.98 kg/m    Subjective:    Patient ID: Alyssa Hoffman, female    DOB: 10/16/1960, 57 y.o.   MRN: [id number]  HPI: Alyssa Hoffman is a 57 y.o. female presenting on 01/12/2018 for Infected finger (Right index)  This patient had a manicure about 2 weeks ago.  She has had swelling around the right index finger.  It does go down to the first joint she denies any fever or chills at this time.  She was seen through urgent care and given a short round of antibiotic but it did not completely resolve.  She would like to have further treatment for this.  Past Medical History:  Diagnosis Date  . Depression   . High cholesterol   . Hives of unknown origin    Relevant past medical, surgical, family and social history reviewed and updated as indicated. Interim medical history since our last visit reviewed. Allergies and medications reviewed and updated. DATA REVIEWED: CHART IN EPIC  Family History reviewed for pertinent findings.  Review of Systems  Constitutional: Negative.   HENT: Negative.   Eyes: Negative.   Respiratory: Negative.   Gastrointestinal: Negative.   Genitourinary: Negative.   Skin: Positive for color change and wound.    Allergies as of 01/12/2018      Reactions   Codeine Nausea And Vomiting   Heart palpitations    Shrimp [shellfish Allergy]       Medication List        Accurate as of 01/12/18 11:59 PM. Always use your most recent med list.          citalopram 20 MG tablet Commonly known as:  CELEXA Take 20 mg by mouth daily.   clindamycin 300 MG capsule Commonly known as:  CLEOCIN Take 1 capsule (300 mg total) by mouth 3 (three) times daily.   loratadine 10 MG tablet Commonly known as:  CLARITIN Take 10 mg by mouth daily.   VITAMIN B COMPLEX PO Take 1 tablet by mouth daily.   VITAMIN C PO Take 1 tablet by mouth daily.     VITAMIN D PO Take 1 tablet by mouth daily.          Objective:    BP 101/71   Pulse 89   Temp (!) 97.1 F (36.2 C) (Oral)   Ht 5\' 8"  (1.727 m)   Wt 190 lb 9.6 oz (86.5 kg)   BMI 28.98 kg/m   Allergies  Allergen Reactions  . Codeine Nausea And Vomiting    Heart palpitations   . Shrimp [Shellfish Allergy]     Wt Readings from Last 3 Encounters:  01/12/18 190 lb 9.6 oz (86.5 kg)  03/16/16 190 lb 3.2 oz (86.3 kg)  01/02/16 187 lb (84.8 kg)    Physical Exam  Constitutional: She is oriented to person, place, and time. She appears well-developed and well-nourished.  HENT:  Head: Normocephalic and atraumatic.  Eyes: Pupils are equal, round, and reactive to light. Conjunctivae and EOM are normal.  Cardiovascular: Normal rate, regular rhythm, normal heart sounds and intact distal pulses.  Pulmonary/Chest: Effort normal and breath sounds normal.  Abdominal: Soft. Bowel sounds are normal.  Neurological: She is alert and oriented to person, place, and time. She has normal reflexes.  Skin: Skin is  warm and dry. Rash noted. Rash is papular. Rash is not pustular. There is erythema.     Swelling around nail, tender to touch  Psychiatric: She has a normal mood and affect. Her behavior is normal. Judgment and thought content normal.        Assessment & Plan:   1. Cellulitis of finger of right hand - clindamycin (CLEOCIN) 300 MG capsule; Take 1 capsule (300 mg total) by mouth 3 (three) times daily.  Dispense: 30 capsule; Refill: 1   Continue all other maintenance medications as listed above.  Follow up plan: No follow-ups on file.  Educational handout given for survey  Remus Loffler PA-C [facility] [address]  [place], [place] [address] [phone]   01/14/2018, 11:11 AM

## 2018-05-11 ENCOUNTER — Encounter: Payer: Self-pay | Admitting: Physician Assistant

## 2018-05-11 ENCOUNTER — Ambulatory Visit: Payer: BC Managed Care – PPO | Admitting: Physician Assistant

## 2018-05-11 ENCOUNTER — Ambulatory Visit (INDEPENDENT_AMBULATORY_CARE_PROVIDER_SITE_OTHER): Payer: BC Managed Care – PPO

## 2018-05-11 VITALS — BP 124/81 | HR 87 | Temp 97.8°F | Ht 68.0 in | Wt 197.8 lb

## 2018-05-11 DIAGNOSIS — G8929 Other chronic pain: Secondary | ICD-10-CM | POA: Diagnosis not present

## 2018-05-11 DIAGNOSIS — M79645 Pain in left finger(s): Secondary | ICD-10-CM | POA: Diagnosis not present

## 2018-05-11 DIAGNOSIS — M545 Low back pain, unspecified: Secondary | ICD-10-CM

## 2018-05-11 DIAGNOSIS — Z23 Encounter for immunization: Secondary | ICD-10-CM

## 2018-05-11 NOTE — Patient Instructions (Signed)

## 2018-05-11 NOTE — Progress Notes (Signed)
BP 124/81   Pulse 87   Temp 97.8 F (36.6 C) (Oral)   Ht 5\' 8"  (1.727 m)   Wt 197 lb 12.8 oz (89.7 kg)   BMI 30.08 kg/m    Subjective:    Patient ID: Alyssa Hoffman, female    DOB: May 26, 1961, 57 y.o.   MRN: [id number]  HPI: Alyssa Hoffman is a 57 y.o. female presenting on 05/11/2018 for Back Pain  This patient comes in for ongoing back pain at the sacroiliac area.  She has a lot of pain when she first gets up in the morning.  She also has it after she has sat for a long time.  She denies any specific injury.  She has been using Aleve and it does help.  She also has been having CBD oil on that and the left thumb that is also giving her chronic pain.  She has no radiation of pain down her leg.  She states that this is been going on for more than 30 years.  However at this time it is a more persistent back pain.  Left thumb with pain in the MCP with slight change in range of motion.  She has not had redness or swelling but has used computers significantly over the years.  Past Medical History:  Diagnosis Date  . Depression   . High cholesterol   . Hives of unknown origin    Relevant past medical, surgical, family and social history reviewed and updated as indicated. Interim medical history since our last visit reviewed. Allergies and medications reviewed and updated. DATA REVIEWED: CHART IN EPIC  Family History reviewed for pertinent findings.  Review of Systems  Constitutional: Negative.   HENT: Negative.   Eyes: Negative.   Respiratory: Negative.   Gastrointestinal: Negative.   Genitourinary: Negative.   Musculoskeletal: Positive for arthralgias, back pain and myalgias.    Allergies as of 05/11/2018      Reactions   Codeine Nausea And Vomiting   Heart palpitations    Shrimp [shellfish Allergy]       Medication List        Accurate as of 05/11/18  8:59 AM. Always use your most recent med list.          citalopram 20 MG tablet Commonly known as:   CELEXA Take 20 mg by mouth daily.   loratadine 10 MG tablet Commonly known as:  CLARITIN Take 10 mg by mouth daily.   VITAMIN B COMPLEX PO Take 1 tablet by mouth daily.   VITAMIN C PO Take 1 tablet by mouth daily.   VITAMIN D PO Take 1 tablet by mouth daily.          Objective:    BP 124/81   Pulse 87   Temp 97.8 F (36.6 C) (Oral)   Ht 5\' 8"  (1.727 m)   Wt 197 lb 12.8 oz (89.7 kg)   BMI 30.08 kg/m   Allergies  Allergen Reactions  . Codeine Nausea And Vomiting    Heart palpitations   . Shrimp [Shellfish Allergy]     Wt Readings from Last 3 Encounters:  05/11/18 197 lb 12.8 oz (89.7 kg)  01/12/18 190 lb 9.6 oz (86.5 kg)  03/16/16 190 lb 3.2 oz (86.3 kg)    Physical Exam  Constitutional: She is oriented to person, place, and time. She appears well-developed and well-nourished.  HENT:  Head: Normocephalic and atraumatic.  Eyes: Pupils are equal, round, and reactive to light.  Conjunctivae and EOM are normal.  Cardiovascular: Normal rate, regular rhythm, normal heart sounds and intact distal pulses.  Pulmonary/Chest: Effort normal and breath sounds normal.  Abdominal: Soft. Bowel sounds are normal.  Musculoskeletal:       Lumbar back: She exhibits tenderness, pain and spasm.       Back:       Left hand: She exhibits decreased range of motion, tenderness and deformity.       Hands: Neurological: She is alert and oriented to person, place, and time. She has normal reflexes.  Skin: Skin is warm and dry. No rash noted.  Psychiatric: She has a normal mood and affect. Her behavior is normal. Judgment and thought content normal.        Assessment & Plan:   1. Chronic pain of left thumb Topical muscle rub  2. Chronic left-sided low back pain without sciatica Back exercises - DG Lumbar Spine 2-3 Views; Future   Continue all other maintenance medications as listed above.  Follow up plan: No follow-ups on file.  Educational handout given for back  exercises  Remus Loffler PA-C [facility] [address]  [place], [place] [address] [phone]   05/11/2018, 8:59 AM

## 2018-05-13 ENCOUNTER — Telehealth: Payer: Self-pay | Admitting: Physician Assistant

## 2018-05-18 NOTE — Telephone Encounter (Signed)
Pt notified of results Verbalizes understanding 

## 2018-07-02 ENCOUNTER — Ambulatory Visit: Payer: BC Managed Care – PPO | Admitting: Pediatrics

## 2018-07-02 ENCOUNTER — Encounter: Payer: Self-pay | Admitting: Pediatrics

## 2018-07-02 VITALS — BP 133/83 | HR 76 | Temp 97.5°F | Ht 68.0 in | Wt 200.0 lb

## 2018-07-02 DIAGNOSIS — L309 Dermatitis, unspecified: Secondary | ICD-10-CM | POA: Diagnosis not present

## 2018-07-02 NOTE — Progress Notes (Signed)
  Subjective:   Patient ID: Garnet SierrasMelessa K Fukushima, female    DOB: 12/08/1960, 57 y.o.   MRN: [id number] CC: Rash on chest and side of neck  HPI: Yailene K Alwyn RenHopper is a 57 y.o. female   2 days ago noticed several red bumps behind her left ear, several more red bumps slightly larger on her left upper chest.  They itched the first day.  Put tears still on it.  No itching or pain now.  They are not filled with fluid.  Not bothering her at all now.  She has a couple dogs at home she is regularly around, playing and in close contact with.  No fevers.  No other rash.  Relevant past medical, surgical, family and social history reviewed. Allergies and medications reviewed and updated. Social History   Tobacco Use  Smoking Status Never Smoker  Smokeless Tobacco Never Used   ROS: Per HPI   Objective:    BP 133/83   Pulse 76   Temp (!) 97.5 F (36.4 C) (Oral)   Ht 5\' 8"  (1.727 m)   Wt 200 lb (90.7 kg)   BMI 30.41 kg/m   Wt Readings from Last 3 Encounters:  07/02/18 200 lb (90.7 kg)  05/11/18 197 lb 12.8 oz (89.7 kg)  01/12/18 190 lb 9.6 oz (86.5 kg)    Gen: NAD, alert, cooperative with exam, NCAT EYES: EOMI, no conjunctival injection, or no icterus CV: NRRR, normal S1/S2, no murmur, distal pulses 2+ b/l Resp: CTABL, no wheezes, normal WOB Ext: No edema, warm Neuro: Alert and oriented, strength equal b/l UE and LE, coordination grossly normal MSK: normal muscle bulk Skin: 4-5 1 to 2 mm flesh to red papules behind left ear.  Left upper chest with approximately 4-5 1 mm papule surrounded by 5 mm of erythema.  No vesicles.  Blanching erythema.  Assessment & Plan:  Zykerria was seen today for rash on chest and side of neck.  Diagnoses and all orders for this visit:  Dermatitis Okay to use hydrocortisone over-the-counter if itching returns.  Keep well-hydrated.  Any worsening let me know.  Low suspicion for shingles, not isolated to one dermatome.  Asymptomatic right now.  Follow up  plan: Return if symptoms worsen or fail to improve. Rex Krasarol Nahara Dona, MD [facility]

## 2019-05-12 ENCOUNTER — Other Ambulatory Visit: Payer: Self-pay | Admitting: Obstetrics and Gynecology

## 2019-05-12 DIAGNOSIS — N6320 Unspecified lump in the left breast, unspecified quadrant: Secondary | ICD-10-CM

## 2019-05-24 ENCOUNTER — Other Ambulatory Visit: Payer: Self-pay

## 2019-05-24 ENCOUNTER — Ambulatory Visit
Admission: RE | Admit: 2019-05-24 | Discharge: 2019-05-24 | Disposition: A | Discharge status: Home / Self Care | Payer: BC Managed Care – PPO | Source: Ambulatory Visit | Attending: Obstetrics and Gynecology | Admitting: Obstetrics and Gynecology

## 2019-05-24 DIAGNOSIS — N6320 Unspecified lump in the left breast, unspecified quadrant: Secondary | ICD-10-CM

## 2020-12-31 ENCOUNTER — Ambulatory Visit (INDEPENDENT_AMBULATORY_CARE_PROVIDER_SITE_OTHER): Payer: BC Managed Care – PPO

## 2020-12-31 ENCOUNTER — Ambulatory Visit: Payer: BC Managed Care – PPO | Admitting: Nurse Practitioner

## 2020-12-31 ENCOUNTER — Encounter: Payer: Self-pay | Admitting: Nurse Practitioner

## 2020-12-31 ENCOUNTER — Ambulatory Visit (HOSPITAL_COMMUNITY)
Admission: RE | Admit: 2020-12-31 | Discharge: 2020-12-31 | Disposition: A | Discharge status: Home / Self Care | Payer: BC Managed Care – PPO | Source: Ambulatory Visit | Attending: Nurse Practitioner | Admitting: Nurse Practitioner

## 2020-12-31 ENCOUNTER — Other Ambulatory Visit: Payer: Self-pay

## 2020-12-31 VITALS — BP 136/86 | HR 88 | Temp 97.0°F | Resp 20 | Ht 68.0 in | Wt 183.0 lb

## 2020-12-31 DIAGNOSIS — R1032 Left lower quadrant pain: Secondary | ICD-10-CM

## 2020-12-31 LAB — URINALYSIS, COMPLETE
Bilirubin, UA: NEGATIVE
Glucose, UA: NEGATIVE
Ketones, UA: NEGATIVE
Nitrite, UA: NEGATIVE
Protein,UA: NEGATIVE
RBC, UA: NEGATIVE
Specific Gravity, UA: 1.02 (ref 1.005–1.030)
Urobilinogen, Ur: 0.2 mg/dL (ref 0.2–1.0)
pH, UA: 5.5 (ref 5.0–7.5)

## 2020-12-31 LAB — MICROSCOPIC EXAMINATION: Bacteria, UA: NONE SEEN

## 2020-12-31 MED ORDER — CIPROFLOXACIN HCL 500 MG PO TABS: 500.0000 mg | ORAL_TABLET | Freq: Two times a day (BID) | ORAL | 0 refills | Status: DC

## 2020-12-31 NOTE — Progress Notes (Signed)
   Subjective:    Patient ID: Alyssa Hoffman, female    DOB: June 23, 1961, 60 y.o.   MRN: [id number]   Chief Complaint: Abdominal Pain   HPI Patient comes in today C/o abdominal pain. Started about over a month ago. She went to urgent care on 12/07/20. She had blood work and Personal assistant. Was told she had large stool burden , but other then that did not see anything. Pain has continued to come and go. She went an voided one day and saw what looked like kidney stone and her pain was gone for over a week. Now pain is coming back again. Pain has been constant for the last 2 days. She rates pain 8/10 today and describes it as stabbing pain on left lower quadrant radiating to back.    Review of Systems  Constitutional:  Negative for chills and fever.  Respiratory: Negative.    Cardiovascular: Negative.   Gastrointestinal:  Positive for abdominal pain and constipation (possibly).  Neurological: Negative.   Psychiatric/Behavioral: Negative.    All other systems reviewed and are negative.     Objective:   Physical Exam Vitals and nursing note reviewed.  Constitutional:      Appearance: She is well-developed.  Abdominal:     General: Bowel sounds are normal.     Tenderness: There is abdominal tenderness in the left lower quadrant.  Genitourinary:    Adnexa: Right adnexa normal.  Skin:    General: Skin is warm.  Neurological:     Mental Status: She is alert.    BP 136/86   Pulse 88   Temp (!) 97 F (36.1 C) (Temporal)   Resp 20   Ht 5\' 8"  (1.727 m)   Wt 183 lb (83 kg)   SpO2 98%   BMI 27.83 kg/m   KUB- moderate stool burden in colon- visible appendix-Preliminary reading by , [name]  [facility]      Assessment & Plan:  [facility] in today with chief complaint of Abdominal Pain   1. Left lower quadrant abdominal pain Ct result sending NPO until scan complete - Urinalysis, Complete - DG Abd 1 View - CT Abdomen Pelvis Wo Contrast; Future    The above assessment and  management plan was discussed with the patient. The patient verbalized understanding of and has agreed to the management plan. Patient is aware to call the clinic if symptoms persist or worsen. Patient is aware when to return to the clinic for a follow-up visit. Patient educated on when it is appropriate to go to the emergency department.   Alyssa Alyssa Sierras, [name]

## 2020-12-31 NOTE — Patient Instructions (Signed)
Diverticulitis °Diverticulitis is when small pouches in your colon (large intestine) get infected or swollen. This causes pain in the belly (abdomen) and watery poop (diarrhea). °These pouches are called diverticula. The pouches form in people who have a condition called diverticulosis. °What are the causes? °This condition may be caused by poop (stool) that gets trapped in the pouches in your colon. The poop lets germs (bacteria) grow in the pouches. This causes the infection. °What increases the risk? °You are more likely to get this condition if you have small pouches in your colon. The risk is higher if: °You are overweight or very overweight (obese). °You do not exercise enough. °You drink alcohol. °You smoke or use products with tobacco in them. °You eat a diet that has a lot of red meat such as beef, pork, or lamb. °You eat a diet that does not have enough fiber in it. °You are older than 62 years of age. °What are the signs or symptoms? °Pain in the belly. Pain is often on the left side, but it may be in other areas. °Fever and feeling cold. °Feeling like you may vomit. °Vomiting. °Having cramps. °Feeling full. °Changes to how often you poop. °Blood in your poop. °How is this treated? °Most cases are treated at home by: °Taking over-the-counter pain medicines. °Following a clear liquid diet. °Taking antibiotic medicines. °Resting. °Very bad cases may need to be treated at a hospital. This may include: °Not eating or drinking. °Taking prescription pain medicine. °Getting antibiotic medicines through an IV tube. °Getting fluid and food through an IV tube. °Having surgery. °When you are feeling better, your doctor may tell you to have a test to check your colon (colonoscopy). °Follow these instructions at home: °Medicines °Take over-the-counter and prescription medicines only as told by your doctor. These include: °Antibiotics. °Pain medicines. °Fiber pills. °Probiotics. °Stool softeners. °If you were  prescribed an antibiotic medicine, take it as told by your doctor. Do not stop taking the antibiotic even if you start to feel better. °Ask your doctor if the medicine prescribed to you requires you to avoid driving or using machinery. °Eating and drinking ° °Follow a diet as told by your doctor. °When you feel better, your doctor may tell you to change your diet. You may need to eat a lot of fiber. Fiber makes it easier to poop (have a bowel movement). Foods with fiber include: °Berries. °Beans. °Lentils. °Green vegetables. °Avoid eating red meat. °General instructions °Do not use any products that contain nicotine or tobacco, such as cigarettes, e-cigarettes, and chewing tobacco. If you need help quitting, ask your doctor. °Exercise 3 or more times a week. Try to get 30 minutes each time. Exercise enough to sweat and make your heart beat faster. °Keep all follow-up visits as told by your doctor. This is important. °Contact a doctor if: °Your pain does not get better. °You are not pooping like normal. °Get help right away if: °Your pain gets worse. °Your symptoms do not get better. °Your symptoms get worse very fast. °You have a fever. °You vomit more than one time. °You have poop that is: °Bloody. °Black. °Tarry. °Summary °This condition happens when small pouches in your colon get infected or swollen. °Take medicines only as told by your doctor. °Follow a diet as told by your doctor. °Keep all follow-up visits as told by your doctor. This is important. °This information is not intended to replace advice given to you by your health care provider. Make sure you   discuss any questions you have with your health care provider. °Document Revised: 04/03/2019 Document Reviewed: 04/03/2019 °Elsevier Patient Education © 2022 [employer] ° °

## 2020-12-31 NOTE — Addendum Note (Signed)
Addended by: Bennie Pierini on: 12/31/2020 12:56 PM   Modules accepted: Orders

## 2021-01-28 NOTE — Telephone Encounter (Signed)
Ntbs Thursday or Friday this week

## 2021-01-30 ENCOUNTER — Ambulatory Visit: Payer: BC Managed Care – PPO | Admitting: Nurse Practitioner

## 2021-01-31 ENCOUNTER — Encounter: Payer: Self-pay | Admitting: Nurse Practitioner

## 2021-01-31 ENCOUNTER — Other Ambulatory Visit: Payer: Self-pay

## 2021-01-31 ENCOUNTER — Ambulatory Visit (INDEPENDENT_AMBULATORY_CARE_PROVIDER_SITE_OTHER): Payer: BC Managed Care – PPO

## 2021-01-31 ENCOUNTER — Ambulatory Visit: Payer: BC Managed Care – PPO | Admitting: Nurse Practitioner

## 2021-01-31 VITALS — BP 112/75 | HR 71 | Temp 98.0°F | Resp 20 | Ht 68.0 in | Wt 182.0 lb

## 2021-01-31 DIAGNOSIS — R197 Diarrhea, unspecified: Secondary | ICD-10-CM

## 2021-01-31 DIAGNOSIS — R1084 Generalized abdominal pain: Secondary | ICD-10-CM | POA: Diagnosis not present

## 2021-01-31 MED ORDER — CIPROFLOXACIN HCL 500 MG PO TABS: 500.0000 mg | ORAL_TABLET | Freq: Two times a day (BID) | ORAL | 0 refills | Status: DC

## 2021-01-31 MED ORDER — METRONIDAZOLE 500 MG PO TABS: 500.0000 mg | ORAL_TABLET | Freq: Two times a day (BID) | ORAL | 0 refills | Status: DC

## 2021-01-31 NOTE — Progress Notes (Signed)
   Subjective:    Patient ID: Alyssa Hoffman, female    DOB: 02-20-1961, 60 y.o.   MRN: [id number]  Chief Complaint: Still having diarrhea (See Mychart messages)   HPI Patient did  visit on 12/31/20 with left lower quadrant pain. We dx her with diverticuitis. She was given cipro. She got better and then about 2 weeks ago she started developing symptoms again, along with diarrhea. Has had diarrhea for 10 days. She said she has booped out corn the last 2 days and she has not had corn in over a month. She has taken a entire box of imodium AD>    Review of Systems  Constitutional:  Negative for diaphoresis.  Eyes:  Negative for pain.  Respiratory:  Negative for shortness of breath.   Cardiovascular:  Negative for chest pain, palpitations and leg swelling.  Gastrointestinal:  Negative for abdominal pain.  Endocrine: Negative for polydipsia.  Skin:  Negative for rash.  Neurological:  Negative for dizziness, weakness and headaches.  Hematological:  Does not bruise/bleed easily.  All other systems reviewed and are negative.     Objective:   Physical Exam Vitals and nursing note reviewed.  Constitutional:      Appearance: Normal appearance.  Cardiovascular:     Rate and Rhythm: Normal rate and regular rhythm.     Pulses: Normal pulses.     Heart sounds: Normal heart sounds.  Pulmonary:     Effort: Pulmonary effort is normal.     Breath sounds: Normal breath sounds.  Skin:    General: Skin is warm.  Neurological:     General: No focal deficit present.     Mental Status: She is alert and oriented to person, place, and time.  Psychiatric:        Mood and Affect: Mood normal.        Behavior: Behavior normal.    BP 112/75   Pulse 71   Temp 98 F (36.7 C) (Temporal)   Resp 20   Ht 5\' 8"  (1.727 m)   Wt 182 lb (82.6 kg)   SpO2 97%   BMI 27.67 kg/m        Assessment & Plan:   Alyssa Hoffman in today with chief complaint of Still having diarrhea (See Mychart  messages)   1. Generalized abdominal pain Probable unresolved diverticulitis - DG Abd 1 View  2. Diarrhea, unspecified type Continue imodium ad otc - Cdiff NAA+O+P+Stool Culture    The above assessment and management plan was discussed with the patient. The patient verbalized understanding of and has agreed to the management plan. Patient is aware to call the clinic if symptoms persist or worsen. Patient is aware when to return to the clinic for a follow-up visit. Patient educated on when it is appropriate to go to the emergency department.   Mary-Margaret , [name]

## 2021-01-31 NOTE — Patient Instructions (Signed)
Diverticulitis Diverticulitis is infection or inflammation of small pouches (diverticula) in the colon that form due to a condition called diverticulosis. Diverticula can trap stool (feces) and bacteria, causing infection and inflammation. Diverticulitis may cause severe stomach pain and diarrhea. It may lead to tissue damage in the colon that causes bleeding or blockage. The diverticula may also burst (rupture) and cause infected stool to enter other areas of the abdomen. What are the causes? This condition is caused by stool becoming trapped in the diverticula, which allows bacteria to grow in the diverticula. This leads to inflammation and infection. What increases the risk? You are more likely to develop this condition if you have diverticulosis. The risk increases if you: Are overweight or obese. Do not get enough exercise. Drink alcohol. Use tobacco products. Eat a diet that has a lot of red meat such as beef, pork, or lamb. Eat a diet that does not include enough fiber. High-fiber foods include fruits, vegetables, beans, nuts, and whole grains. Are over 40 years of age. What are the signs or symptoms? Symptoms of this condition may include: Pain and tenderness in the abdomen. The pain is normally located on the left side of the abdomen, but it may occur in other areas. Fever and chills. Nausea. Vomiting. Cramping. Bloating. Changes in bowel routines. Blood in your stool. How is this diagnosed? This condition is diagnosed based on: Your medical history. A physical exam. Tests to make sure there is nothing else causing your condition. These tests may include: Blood tests. Urine tests. CT scan of the abdomen. How is this treated? Most cases of this condition are mild and can be treated at home. Treatment may include: Taking over-the-counter pain medicines. Following a clear liquid diet. Taking antibiotic medicines by mouth. Resting. More severe cases may need to be treated  at a hospital. Treatment may include: Not eating or drinking. Taking prescription pain medicine. Receiving antibiotic medicines through an IV. Receiving fluids and nutrition through an IV. Surgery. When your condition is under control, your health care provider may recommend that you have a colonoscopy. This is an exam to look at the entire large intestine. During the exam, a lubricated, bendable tube is inserted into the anus and then passed into the rectum, colon, and other parts of the large intestine. A colonoscopy can show how severe your diverticula are and whether something else may be causing your symptoms. Follow these instructions at home: Medicines Take over-the-counter and prescription medicines only as told by your health care provider. These include fiber supplements, probiotics, and stool softeners. If you were prescribed an antibiotic medicine, take it as told by your health care provider. Do not stop taking the antibiotic even if you start to feel better. Ask your health care provider if the medicine prescribed to you requires you to avoid driving or using machinery. Eating and drinking  Follow a full liquid diet or another diet as directed by your health care provider. After your symptoms improve, your health care provider may tell you to change your diet. He or she may recommend that you eat a diet that contains at least 25 grams (25 g) of fiber daily. Fiber makes it easier to pass stool. Healthy sources of fiber include: Berries. One cup contains 4-8 grams of fiber. Beans or lentils. One-half cup contains 5-8 grams of fiber. Green vegetables. One cup contains 4 grams of fiber. Avoid eating red meat. General instructions Do not use any products that contain nicotine or tobacco, such as cigarettes,   e-cigarettes, and chewing tobacco. If you need help quitting, ask your health care provider. Exercise for at least 30 minutes, 3 times each week. You should exercise hard enough to  raise your heart rate and break a sweat. Keep all follow-up visits as told by your health care provider. This is important. You may need to have a colonoscopy. Contact a health care provider if: Your pain does not improve. Your bowel movements do not return to normal. Get help right away if: Your pain gets worse. Your symptoms do not get better with treatment. Your symptoms suddenly get worse. You have a fever. You vomit more than one time. You have stools that are bloody, black, or tarry. Summary Diverticulitis is infection or inflammation of small pouches (diverticula) in the colon that form due to a condition called diverticulosis. Diverticula can trap stool (feces) and bacteria, causing infection and inflammation. You are at higher risk for this condition if you have diverticulosis and you eat a diet that does not include enough fiber. Most cases of this condition are mild and can be treated at home. More severe cases may need to be treated at a hospital. When your condition is under control, your health care provider may recommend that you have an exam called a colonoscopy. This exam can show how severe your diverticula are and whether something else may be causing your symptoms. Keep all follow-up visits as told by your health care provider. This is important. This information is not intended to replace advice given to you by your health care provider. Make sure you discuss any questions you have with your health care provider. Document Revised: 04/03/2019 Document Reviewed: 04/03/2019 Elsevier Patient Education  2022 [employer] Inc[employer]  

## 2022-01-20 ENCOUNTER — Encounter: Payer: Self-pay | Admitting: Nurse Practitioner

## 2022-01-20 ENCOUNTER — Ambulatory Visit: Payer: BC Managed Care – PPO | Admitting: Nurse Practitioner

## 2022-01-20 VITALS — BP 157/93 | HR 80 | Temp 97.8°F | Ht 68.0 in | Wt 192.0 lb

## 2022-01-20 DIAGNOSIS — R110 Nausea: Secondary | ICD-10-CM | POA: Diagnosis not present

## 2022-01-20 DIAGNOSIS — K5792 Diverticulitis of intestine, part unspecified, without perforation or abscess without bleeding: Secondary | ICD-10-CM | POA: Diagnosis not present

## 2022-01-20 DIAGNOSIS — R1012 Left upper quadrant pain: Secondary | ICD-10-CM

## 2022-01-20 LAB — URINALYSIS, ROUTINE W REFLEX MICROSCOPIC
Bilirubin, UA: NEGATIVE
Glucose, UA: NEGATIVE
Ketones, UA: NEGATIVE
Leukocytes,UA: NEGATIVE
Nitrite, UA: NEGATIVE
Protein,UA: NEGATIVE
RBC, UA: NEGATIVE
Specific Gravity, UA: 1.02 (ref 1.005–1.030)
Urobilinogen, Ur: 0.2 mg/dL (ref 0.2–1.0)
pH, UA: 6 (ref 5.0–7.5)

## 2022-01-20 MED ORDER — CIPROFLOXACIN HCL 500 MG PO TABS: 500.0000 mg | ORAL_TABLET | Freq: Two times a day (BID) | ORAL | 0 refills | Status: AC

## 2022-01-20 MED ORDER — ONDANSETRON HCL 4 MG PO TABS: 4.0000 mg | ORAL_TABLET | Freq: Three times a day (TID) | ORAL | 0 refills | Status: AC | PRN

## 2022-01-20 MED ORDER — METRONIDAZOLE 500 MG PO TABS: 500.0000 mg | ORAL_TABLET | Freq: Two times a day (BID) | ORAL | 0 refills | Status: AC

## 2022-01-20 NOTE — Progress Notes (Signed)
Acute Office Visit  Subjective:     Patient ID: Alyssa Hoffman, female    DOB: 07-Nov-1960, 61 y.o.   MRN: [id number]  Chief Complaint  Patient presents with   Abdominal Pain    Left sided back pain with left sided abdominal pain x 1 day    Nausea    X 1 day    Abdominal Pain This is a new problem. The onset quality is sudden. The problem occurs constantly. The problem has been unchanged. The pain is located in the LLQ. The pain is at a severity of 5/10. The pain is moderate. The quality of the pain is aching and cramping. The abdominal pain does not radiate. Associated symptoms include nausea. Pertinent negatives include no belching, constipation, dysuria, fever, frequency or vomiting. Nothing aggravates the pain. The pain is relieved by Nothing. She has tried nothing for the symptoms. The treatment provided no relief.     Review of Systems  Constitutional:  Negative for fever.  HENT: Negative.    Respiratory: Negative.    Cardiovascular: Negative.   Gastrointestinal:  Positive for abdominal pain and nausea. Negative for constipation and vomiting.  Genitourinary:  Negative for dysuria, frequency and urgency.  Skin: Negative.  Negative for itching and rash.  All other systems reviewed and are negative.       Objective:    BP (!) 157/93   Pulse 80   Temp 97.8 F (36.6 C) (Temporal)   Ht 5\' 8"  (1.727 m)   Wt 192 lb (87.1 kg)   SpO2 95%   BMI 29.19 kg/m  BP Readings from Last 3 Encounters:  01/20/22 (!) 157/93  01/31/21 112/75  12/31/20 136/86   Wt Readings from Last 3 Encounters:  01/20/22 192 lb (87.1 kg)  01/31/21 182 lb (82.6 kg)  12/31/20 183 lb (83 kg)      Physical Exam Vitals and nursing note reviewed.  Constitutional:      Appearance: She is well-developed.  HENT:     Head: Normocephalic.     Right Ear: External ear normal.     Left Ear: External ear normal.     Mouth/Throat:     Mouth: Mucous membranes are moist.  Eyes:      Conjunctiva/sclera: Conjunctivae normal.  Cardiovascular:     Rate and Rhythm: Normal rate and regular rhythm.  Pulmonary:     Effort: Pulmonary effort is normal.     Breath sounds: Normal breath sounds.  Abdominal:     General: Bowel sounds are normal.     Palpations: Abdomen is soft.  Skin:    General: Skin is warm.     Findings: No rash.  Neurological:     General: No focal deficit present.     Mental Status: She is alert and oriented to person, place, and time.     No results found for any visits on 01/20/22.      Assessment & Plan:  Patient presents with symptoms of diverticulitis with worsening left lower abdominal pain and nausea. Advised patient to increase hydration, Tylenol for pain,  Cipro 500 mg tablet by mouth 2 times daily, flagyl by mouth twice daily. Zofran for nausea,  Follow up with worsening unresolved symptoms.   Problem List Items Addressed This Visit   None Visit Diagnoses     Diverticulitis    -  Primary   Relevant Medications   ondansetron (ZOFRAN) 4 MG tablet   metroNIDAZOLE (FLAGYL) 500 MG tablet   ciprofloxacin (CIPRO) 500  MG tablet   Left upper quadrant abdominal pain       Relevant Orders   Urinalysis, Routine w reflex microscopic   Nausea       Relevant Medications   ondansetron (ZOFRAN) 4 MG tablet       Meds ordered this encounter  Medications   ondansetron (ZOFRAN) 4 MG tablet    Sig: Take 1 tablet (4 mg total) by mouth every 8 (eight) hours as needed for nausea or vomiting.    Dispense:  20 tablet    Refill:  0    Order Specific Question:   Supervising Provider    Answer:   Mechele Claude [[id number]]   metroNIDAZOLE (FLAGYL) 500 MG tablet    Sig: Take 1 tablet (500 mg total) by mouth 2 (two) times daily.    Dispense:  14 tablet    Refill:  0    Order Specific Question:   Supervising Provider    Answer:   Standley Brooking   ciprofloxacin (CIPRO) 500 MG tablet    Sig: Take 1 tablet (500 mg total) by mouth 2 (two) times  daily.    Dispense:  14 tablet    Refill:  0    Order Specific Question:   Supervising Provider    Answer:   Mechele Claude [id number]    Return if symptoms worsen or fail to improve.  Daryll Drown, NP

## 2022-01-20 NOTE — Patient Instructions (Signed)
Abdominal Pain, Adult Many things can cause belly (abdominal) pain. Most times, belly pain is not dangerous. Many cases of belly pain can be watched and treated at home. Sometimes, though, belly pain is serious. Your doctor will try to find the cause of your belly pain. Follow these instructions at home:  Medicines Take over-the-counter and prescription medicines only as told by your doctor. Do not take medicines that help you poop (laxatives) unless told by your doctor. General instructions Watch your belly pain for any changes. Drink enough fluid to keep your pee (urine) pale yellow. Keep all follow-up visits as told by your doctor. This is important. Contact a doctor if: Your belly pain changes or gets worse. You are not hungry, or you lose weight without trying. You are having trouble pooping (constipated) or have watery poop (diarrhea) for more than 2-3 days. You have pain when you pee or poop. Your belly pain wakes you up at night. Your pain gets worse with meals, after eating, or with certain foods. You are vomiting and cannot keep anything down. You have a fever. You have blood in your pee. Get help right away if: Your pain does not go away as soon as your doctor says it should. You cannot stop vomiting. Your pain is only in areas of your belly, such as the right side or the left lower part of the belly. You have bloody or black poop, or poop that looks like tar. You have very bad pain, cramping, or bloating in your belly. You have signs of not having enough fluid or water in your body (dehydration), such as: Dark pee, very little pee, or no pee. Cracked lips. Dry mouth. Sunken eyes. Sleepiness. Weakness. You have trouble breathing or chest pain. Summary Many cases of belly pain can be watched and treated at home. Watch your belly pain for any changes. Take over-the-counter and prescription medicines only as told by your doctor. Contact a doctor if your belly pain  changes or gets worse. Get help right away if you have very bad pain, cramping, or bloating in your belly. This information is not intended to replace advice given to you by your health care provider. Make sure you discuss any questions you have with your health care provider. Document Revised: 10/31/2018 Document Reviewed: 10/31/2018 Elsevier Patient Education  2023 [employer]vier Inc.  

## 2022-08-04 ENCOUNTER — Encounter: Payer: Self-pay | Admitting: Family Medicine

## 2022-08-25 ENCOUNTER — Encounter: Payer: Self-pay | Admitting: Family Medicine

## 2022-09-14 ENCOUNTER — Ambulatory Visit: Payer: BC Managed Care – PPO | Admitting: Family Medicine

## 2022-09-14 ENCOUNTER — Encounter: Payer: Self-pay | Admitting: Family Medicine

## 2022-09-14 ENCOUNTER — Telehealth (INDEPENDENT_AMBULATORY_CARE_PROVIDER_SITE_OTHER): Payer: BC Managed Care – PPO | Admitting: Family Medicine

## 2022-09-14 DIAGNOSIS — R42 Dizziness and giddiness: Secondary | ICD-10-CM

## 2022-09-14 MED ORDER — PROMETHAZINE HCL 25 MG PO TABS
25.0000 mg | ORAL_TABLET | ORAL | 0 refills | Status: AC | PRN
Start: 2022-09-14 — End: ?

## 2022-09-14 NOTE — Progress Notes (Signed)
Subjective:    Patient ID: Alyssa Hoffman, female    DOB: 1960-11-08, 62 y.o.   MRN: [id number]   HPI: Alyssa Hoffman is a 62 y.o. female presenting for video visit. First occurred 3 mos ago. Then recurred 4 days ago. Felt like she was rolling. Felt nauseated first day only. Last night worsened. She turned a bit quickly while working in her kitchen and had instant onset of room moving. Couldn't walk straight.   Sleeping upright helps. Denies tinnitus and hearing change. No otalgia.No ur sx.  Seems like she slept with her head lower than her body.       01/20/2022    9:19 AM 01/31/2021    9:17 AM 12/31/2020    9:06 AM 07/02/2018    9:17 AM 05/11/2018    8:42 AM  Depression screen PHQ 2/9  Decreased Interest 0 0 0 0 0  Down, Depressed, Hopeless 0 0 0 0 0  PHQ - 2 Score 0 0 0 0 0  Altered sleeping 0 0     Tired, decreased energy 0 1     Change in appetite 0 0     Feeling bad or failure about yourself  0 0     Trouble concentrating 0 0     Moving slowly or fidgety/restless 0 0     Suicidal thoughts 0 0     PHQ-9 Score 0 1     Difficult doing work/chores Not difficult at all Not difficult at all        Relevant past medical, surgical, family and social history reviewed and updated as indicated.  Interim medical history since our last visit reviewed. Allergies and medications reviewed and updated.  ROS:  Review of Systems  Constitutional: Negative.   HENT: Negative.  Negative for ear pain and hearing loss.   Eyes:  Negative for visual disturbance.  Gastrointestinal:  Negative for nausea.     Social History   Tobacco Use  Smoking Status Never  Smokeless Tobacco Never       Objective:     Wt Readings from Last 3 Encounters:  01/20/22 192 lb (87.1 kg)  01/31/21 182 lb (82.6 kg)  12/31/20 183 lb (83 kg)     Exam deferred. Pt. Harboring due to COVID 19. Phone visit performed.   Assessment & Plan:   1. Vertigo     Meds ordered this encounter  Medications    promethazine (PHENERGAN) 25 MG tablet    Sig: Take 1 tablet (25 mg total) by mouth every 4 (four) hours as needed for nausea or vomiting.    Dispense:  30 tablet    Refill:  0    No orders of the defined types were placed in this encounter.     Diagnoses and all orders for this visit:  Vertigo  Other orders -     promethazine (PHENERGAN) 25 MG tablet; Take 1 tablet (25 mg total) by mouth every 4 (four) hours as needed for nausea or vomiting.    Virtual VisitI discussed the limitations, risks, security and privacy concerns of performing an evaluation and management service by video and the availability of in person appointments. The patient was identified with two identifiers. Pt.expressed understanding and agreed to proceed. Pt. Is at home. Dr. Livia Snellen is in his office.  Follow Up Instructions:   I discussed the assessment and treatment plan with the patient. The patient was provided an opportunity to ask questions and all were answered. The  patient agreed with the plan and demonstrated an understanding of the instructions.   The patient was advised to call back or seek an in-person evaluation if the symptoms worsen or if the condition fails to improve as anticipated.   Total minutes including chart review and phone contact time: 14   Follow up plan: Return if symptoms worsen or fail to improve.  Claretta Fraise, MD [place]

## 2022-09-23 DIAGNOSIS — Z1329 Encounter for screening for other suspected endocrine disorder: Secondary | ICD-10-CM | POA: Diagnosis not present

## 2022-09-23 DIAGNOSIS — Z13228 Encounter for screening for other metabolic disorders: Secondary | ICD-10-CM | POA: Diagnosis not present

## 2022-09-23 DIAGNOSIS — Z6830 Body mass index (BMI) 30.0-30.9, adult: Secondary | ICD-10-CM | POA: Diagnosis not present

## 2022-09-23 DIAGNOSIS — Z1321 Encounter for screening for nutritional disorder: Secondary | ICD-10-CM | POA: Diagnosis not present

## 2022-09-23 DIAGNOSIS — Z01419 Encounter for gynecological examination (general) (routine) without abnormal findings: Secondary | ICD-10-CM | POA: Diagnosis not present

## 2022-10-22 DIAGNOSIS — H35342 Macular cyst, hole, or pseudohole, left eye: Secondary | ICD-10-CM | POA: Diagnosis not present

## 2022-10-22 DIAGNOSIS — H43813 Vitreous degeneration, bilateral: Secondary | ICD-10-CM | POA: Diagnosis not present

## 2022-10-22 DIAGNOSIS — H2513 Age-related nuclear cataract, bilateral: Secondary | ICD-10-CM | POA: Diagnosis not present

## 2022-10-22 DIAGNOSIS — H35373 Puckering of macula, bilateral: Secondary | ICD-10-CM | POA: Diagnosis not present

## 2022-10-31 IMAGING — DX DG ABDOMEN 1V
2 series · 2 of 2 positions shown · non-contrast
Comparison: December 31, 2020

CLINICAL DATA: Abdominal pain.

EXAM:
ABDOMEN - 1 VIEW

[abdomen kub (1 of 2)]
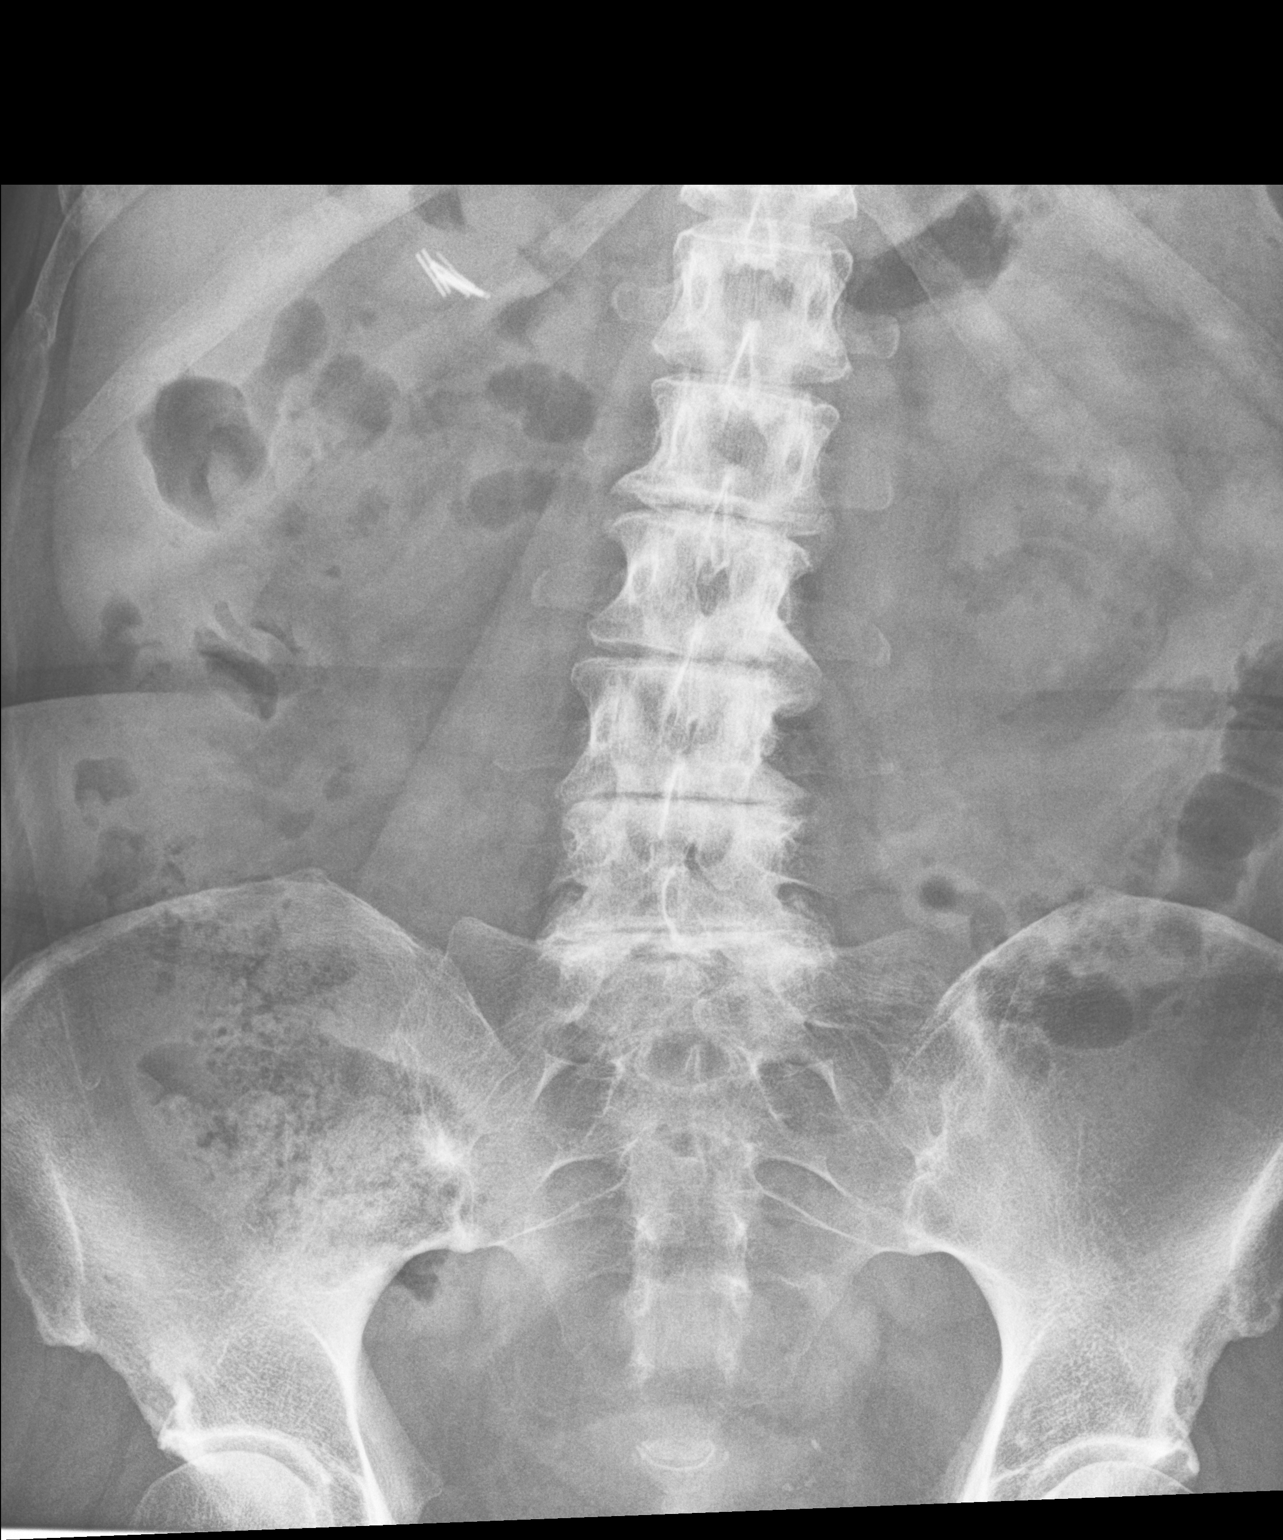

[abdomen kub (2 of 2)]
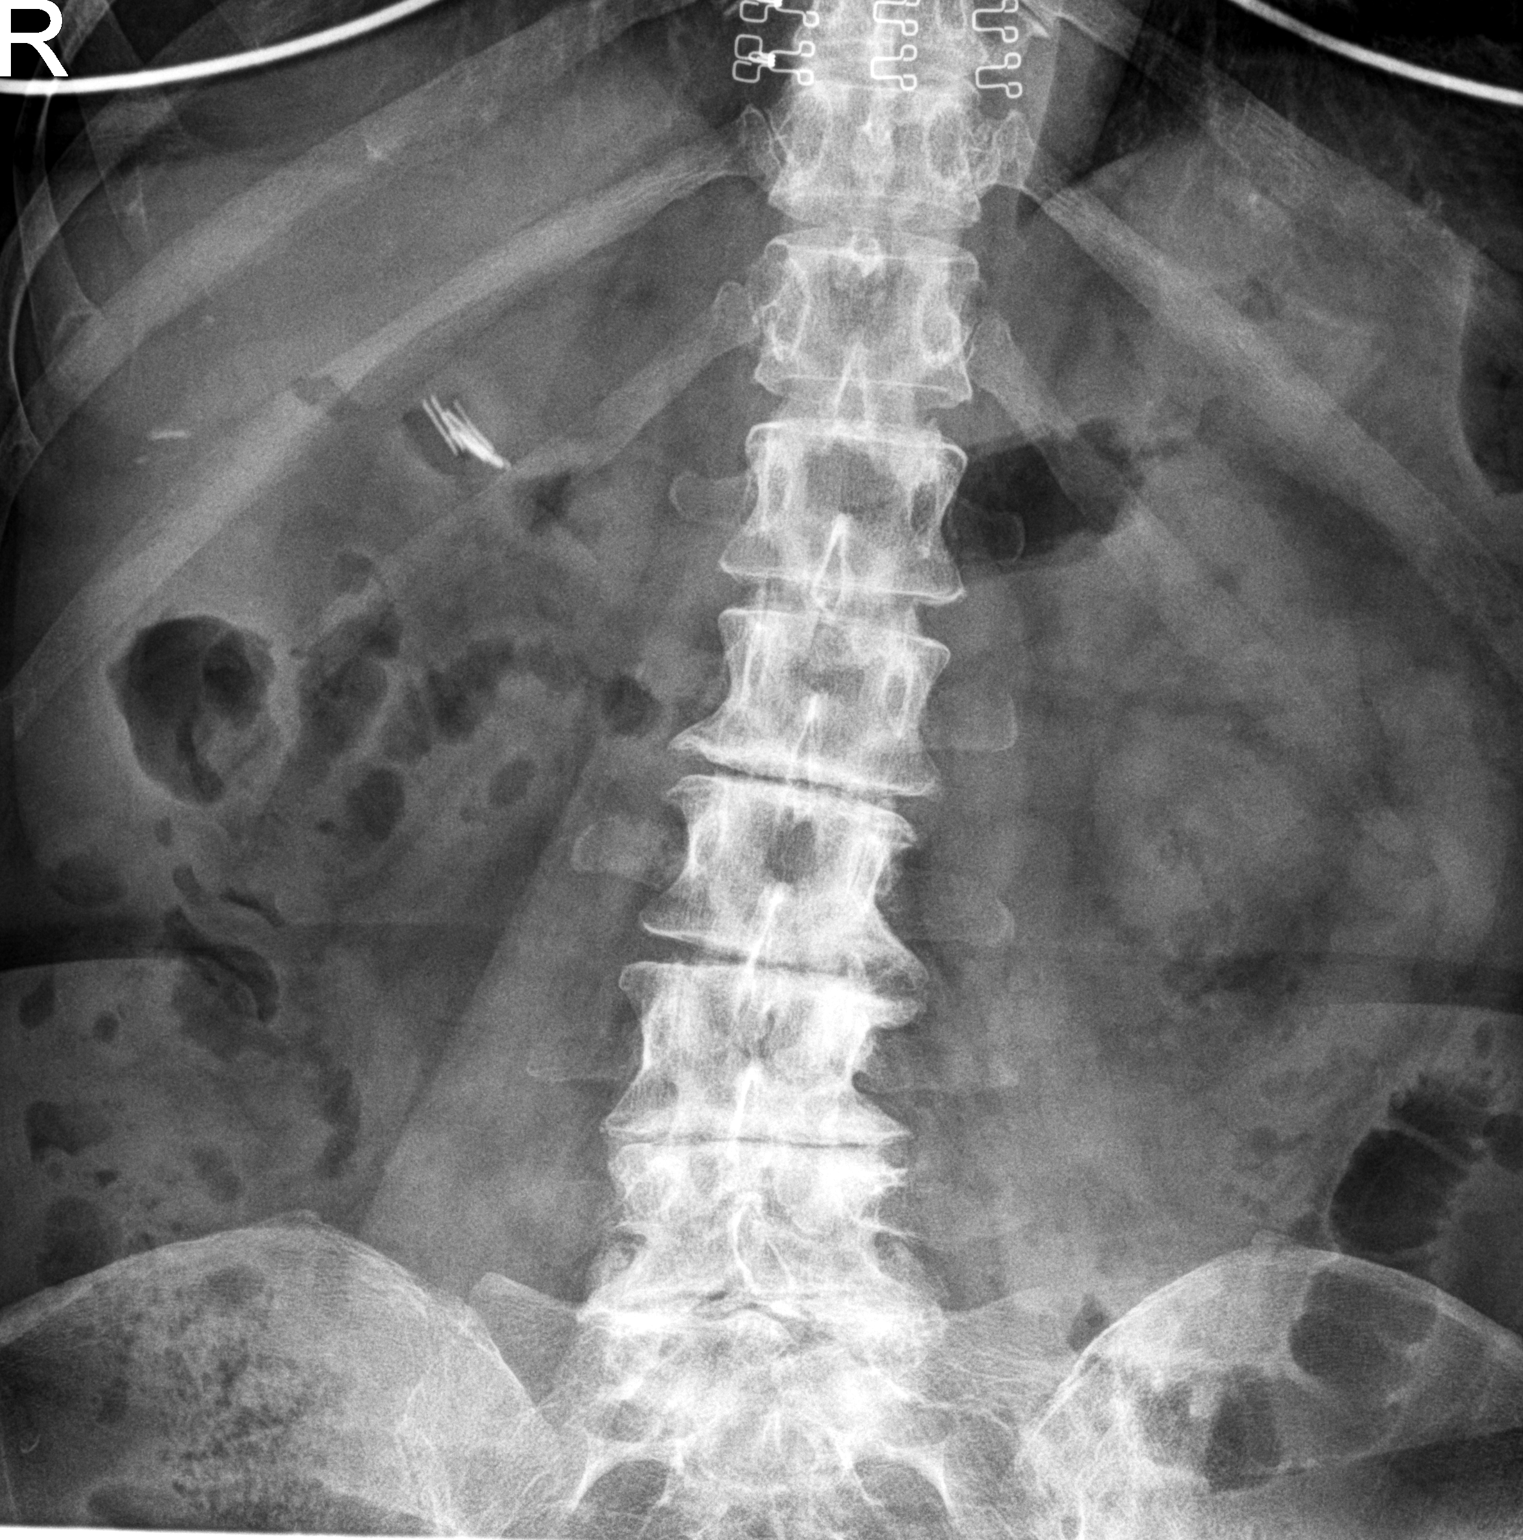

[2 of 2 positions shown; findings below may reference images not displayed]

FINDINGS: Cholecystectomy clips. A tiny radiopaque focus over the mid left
kidney could represent bowel contents or tiny stone. No other stones
identified. Degenerative changes lumbar spine. No other acute
abnormalities.
IMPRESSION: Tiny stone versus bowel contents project over the left mid kidney.
No other acute abnormalities identified to explain the patient's
pain.

## 2022-12-09 DIAGNOSIS — Z1231 Encounter for screening mammogram for malignant neoplasm of breast: Secondary | ICD-10-CM | POA: Diagnosis not present

## 2023-04-26 DIAGNOSIS — H2513 Age-related nuclear cataract, bilateral: Secondary | ICD-10-CM | POA: Diagnosis not present

## 2023-04-26 DIAGNOSIS — H43813 Vitreous degeneration, bilateral: Secondary | ICD-10-CM | POA: Diagnosis not present

## 2023-04-26 DIAGNOSIS — H35342 Macular cyst, hole, or pseudohole, left eye: Secondary | ICD-10-CM | POA: Diagnosis not present

## 2023-04-26 DIAGNOSIS — H35373 Puckering of macula, bilateral: Secondary | ICD-10-CM | POA: Diagnosis not present

## 2023-11-18 LAB — HM MAMMOGRAPHY
